# Patient Record
Sex: Female | Born: 1990 | Race: White | Hispanic: No | Marital: Married | Smoking: Never smoker
Health system: Southern US, Community
[De-identification: ages and names within clinical notes are randomized; demographics above are authoritative.]

## PROBLEM LIST (undated history)

## (undated) DIAGNOSIS — Z8489 Family history of other specified conditions: Secondary | ICD-10-CM

## (undated) DIAGNOSIS — C801 Malignant (primary) neoplasm, unspecified: Secondary | ICD-10-CM

## (undated) DIAGNOSIS — C73 Malignant neoplasm of thyroid gland: Secondary | ICD-10-CM

## (undated) HISTORY — PX: APPENDECTOMY: SHX54

## (undated) HISTORY — PX: OTHER SURGICAL HISTORY: SHX169

## (undated) HISTORY — PX: LYMPH NODE BIOPSY: SHX201

## (undated) HISTORY — DX: Malignant neoplasm of thyroid gland: C73

---
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
## 2009-04-28 ENCOUNTER — Emergency Department: Payer: Self-pay | Admitting: Emergency Medicine

## 2009-12-07 ENCOUNTER — Emergency Department: Payer: Self-pay | Admitting: Unknown Physician Specialty

## 2011-10-02 ENCOUNTER — Ambulatory Visit: Payer: Self-pay | Admitting: Orthopaedic Surgery

## 2011-10-17 ENCOUNTER — Emergency Department: Payer: Self-pay | Admitting: Emergency Medicine

## 2011-10-17 LAB — COMPREHENSIVE METABOLIC PANEL
Albumin: 4 g/dL (ref 3.4–5.0)
Alkaline Phosphatase: 66 U/L (ref 50–136)
BUN: 14 mg/dL (ref 7–18)
Bilirubin,Total: 0.5 mg/dL (ref 0.2–1.0)
Chloride: 107 mmol/L (ref 98–107)
Co2: 25 mmol/L (ref 21–32)
EGFR (African American): >60
EGFR (Non-African Amer.): >60
Osmolality: 287 (ref 275–301)
SGOT(AST): 20 U/L (ref 15–37)
SGPT (ALT): 30 U/L
Total Protein: 7.4 g/dL (ref 6.4–8.2)

## 2011-10-17 LAB — CBC
HCT: 48.2 % — ABNORMAL HIGH (ref 35.0–47.0)
HGB: 16.1 g/dL — ABNORMAL HIGH (ref 12.0–16.0)
MCH: 32.1 pg (ref 26.0–34.0)
MCHC: 33.5 g/dL (ref 32.0–36.0)
MCV: 96 fL (ref 80–100)
Platelet: 343 10*3/uL (ref 150–440)
RBC: 5.03 10*6/uL (ref 3.80–5.20)
RDW: 11.6 % (ref 11.5–14.5)

## 2011-10-17 LAB — URINALYSIS, COMPLETE
Leukocyte Esterase: NEGATIVE
Nitrite: NEGATIVE
Protein: NEGATIVE
Specific Gravity: 1.014 (ref 1.003–1.030)
WBC UR: <1 /HPF (ref 0–5)

## 2011-10-17 LAB — LIPASE, BLOOD: Lipase: 116 U/L (ref 73–393)

## 2011-10-17 LAB — PREGNANCY, URINE: Pregnancy Test, Urine: NEGATIVE m[IU]/mL

## 2012-08-01 DIAGNOSIS — R5383 Other fatigue: Secondary | ICD-10-CM | POA: Insufficient documentation

## 2013-03-06 ENCOUNTER — Observation Stay: Payer: Self-pay | Admitting: Obstetrics and Gynecology

## 2013-03-06 LAB — PIH PROFILE
Anion Gap: 9 (ref 7–16)
Calcium, Total: 8.6 mg/dL (ref 8.5–10.1)
Chloride: 111 mmol/L — ABNORMAL HIGH (ref 98–107)
Co2: 21 mmol/L (ref 21–32)
Creatinine: 0.81 mg/dL (ref 0.60–1.30)
EGFR (African American): >60
Glucose: 97 mg/dL (ref 65–99)
HCT: 37 % (ref 35.0–47.0)
MCHC: 35.2 g/dL (ref 32.0–36.0)
Platelet: 267 10*3/uL (ref 150–440)
Potassium: 3.7 mmol/L (ref 3.5–5.1)
RBC: 3.92 10*6/uL (ref 3.80–5.20)
SGOT(AST): 14 U/L — ABNORMAL LOW (ref 15–37)
Sodium: 141 mmol/L (ref 136–145)

## 2013-03-06 LAB — PROTEIN / CREATININE RATIO, URINE: Creatinine, Urine: 107.8 mg/dL (ref 30.0–125.0)

## 2013-03-07 ENCOUNTER — Ambulatory Visit: Payer: Self-pay | Admitting: Obstetrics and Gynecology

## 2013-03-08 ENCOUNTER — Inpatient Hospital Stay: Payer: Self-pay | Admitting: Obstetrics and Gynecology

## 2013-03-08 LAB — PROTEIN / CREATININE RATIO, URINE
Creatinine, Urine: 155.2 mg/dL — ABNORMAL HIGH (ref 30.0–125.0)
Protein, Random Urine: 754 mg/dL — ABNORMAL HIGH (ref 0–12)

## 2013-03-08 LAB — PIH PROFILE
Anion Gap: 7 (ref 7–16)
BUN: 12 mg/dL (ref 7–18)
Chloride: 111 mmol/L — ABNORMAL HIGH (ref 98–107)
Co2: 23 mmol/L (ref 21–32)
EGFR (African American): >60
EGFR (Non-African Amer.): >60
Glucose: 72 mg/dL (ref 65–99)
HGB: 12.6 g/dL (ref 12.0–16.0)
MCHC: 34.4 g/dL (ref 32.0–36.0)
MCV: 95 fL (ref 80–100)
Osmolality: 280 (ref 275–301)
Potassium: 3.9 mmol/L (ref 3.5–5.1)
RBC: 3.84 10*6/uL (ref 3.80–5.20)
RDW: 12.2 % (ref 11.5–14.5)
SGOT(AST): 10 U/L — ABNORMAL LOW (ref 15–37)
Uric Acid: 5.9 mg/dL (ref 2.6–6.0)

## 2013-03-09 LAB — PIH PROFILE
BUN: 14 mg/dL (ref 7–18)
Co2: 21 mmol/L (ref 21–32)
EGFR (African American): >60
EGFR (Non-African Amer.): >60
MCHC: 34.3 g/dL (ref 32.0–36.0)
MCV: 96 fL (ref 80–100)
Osmolality: 284 (ref 275–301)
Potassium: 3.8 mmol/L (ref 3.5–5.1)
RBC: 3.83 10*6/uL (ref 3.80–5.20)
SGOT(AST): 14 U/L — ABNORMAL LOW (ref 15–37)
Sodium: 143 mmol/L (ref 136–145)
Uric Acid: 6.2 mg/dL — ABNORMAL HIGH (ref 2.6–6.0)

## 2013-03-10 LAB — PIH PROFILE
Anion Gap: 5 — ABNORMAL LOW (ref 7–16)
Anion Gap: 6 — ABNORMAL LOW (ref 7–16)
BUN: 12 mg/dL (ref 7–18)
Calcium, Total: 8.7 mg/dL (ref 8.5–10.1)
Calcium, Total: 9.4 mg/dL (ref 8.5–10.1)
Chloride: 110 mmol/L — ABNORMAL HIGH (ref 98–107)
Co2: 25 mmol/L (ref 21–32)
Creatinine: 0.77 mg/dL (ref 0.60–1.30)
Creatinine: 0.66 mg/dL (ref 0.60–1.30)
EGFR (African American): >60
EGFR (Non-African Amer.): >60
Glucose: 71 mg/dL (ref 65–99)
HCT: 38 % (ref 35.0–47.0)
HCT: 41.9 % (ref 35.0–47.0)
HGB: 14.5 g/dL (ref 12.0–16.0)
MCH: 32.6 pg (ref 26.0–34.0)
MCHC: 34.3 g/dL (ref 32.0–36.0)
MCHC: 34.6 g/dL (ref 32.0–36.0)
MCV: 95 fL (ref 80–100)
MCV: 94 fL (ref 80–100)
Potassium: 3.9 mmol/L (ref 3.5–5.1)
Potassium: 4.3 mmol/L (ref 3.5–5.1)
RBC: 4 10*6/uL (ref 3.80–5.20)
RDW: 12.2 % (ref 11.5–14.5)
RDW: 12.3 % (ref 11.5–14.5)
SGOT(AST): 26 U/L (ref 15–37)
SGOT(AST): 55 U/L — ABNORMAL HIGH (ref 15–37)
Sodium: 141 mmol/L (ref 136–145)
Sodium: 140 mmol/L (ref 136–145)
Uric Acid: 5.7 mg/dL (ref 2.6–6.0)
WBC: 20.4 10*3/uL — ABNORMAL HIGH (ref 3.6–11.0)

## 2013-03-10 LAB — PROTEIN, URINE, 24 HOUR
Collection Hours: 24 hours
Protein, 24 Hour Urine: 16646 mg/24HR — ABNORMAL HIGH (ref 30–149)

## 2013-03-10 LAB — PROTEIN / CREATININE RATIO, URINE: Protein, Random Urine: 1628 mg/dL — ABNORMAL HIGH (ref 0–12)

## 2013-03-11 LAB — PIH PROFILE
Anion Gap: 8 (ref 7–16)
Calcium, Total: 8.4 mg/dL — ABNORMAL LOW (ref 8.5–10.1)
Co2: 24 mmol/L (ref 21–32)
EGFR (Non-African Amer.): >60
HCT: 37.9 % (ref 35.0–47.0)
MCH: 32.2 pg (ref 26.0–34.0)
MCHC: 34 g/dL (ref 32.0–36.0)
Osmolality: 277 (ref 275–301)
Platelet: 231 10*3/uL (ref 150–440)
Potassium: 4.1 mmol/L (ref 3.5–5.1)
SGOT(AST): 54 U/L — ABNORMAL HIGH (ref 15–37)
Sodium: 139 mmol/L (ref 136–145)
WBC: 18 10*3/uL — ABNORMAL HIGH (ref 3.6–11.0)

## 2013-03-14 LAB — PATHOLOGY REPORT

## 2014-09-06 IMAGING — US ULTRAOUND OB LIMITED - NRPT [facility]
1 series · 14 of 28 positions shown · non-contrast
Comparison: none

[Series 1: ultraound ob limited - nrpt [facility] · 0.28mm/px · 14 of 42 slices shown]
[im 2/42]
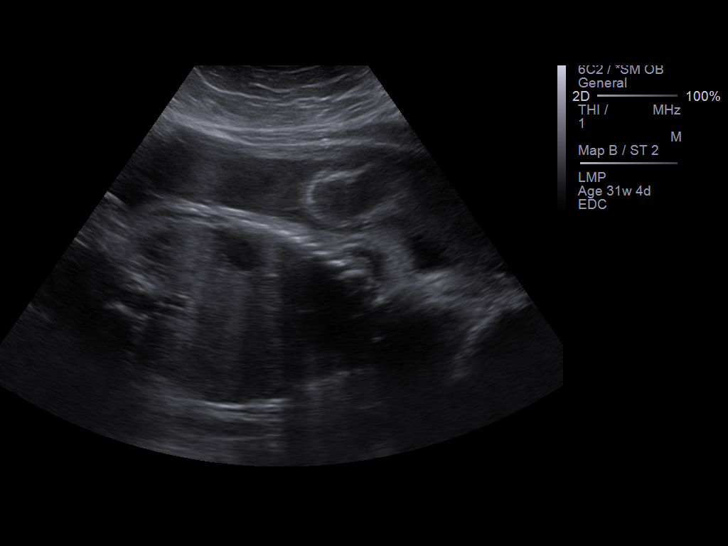
[im 5/42]
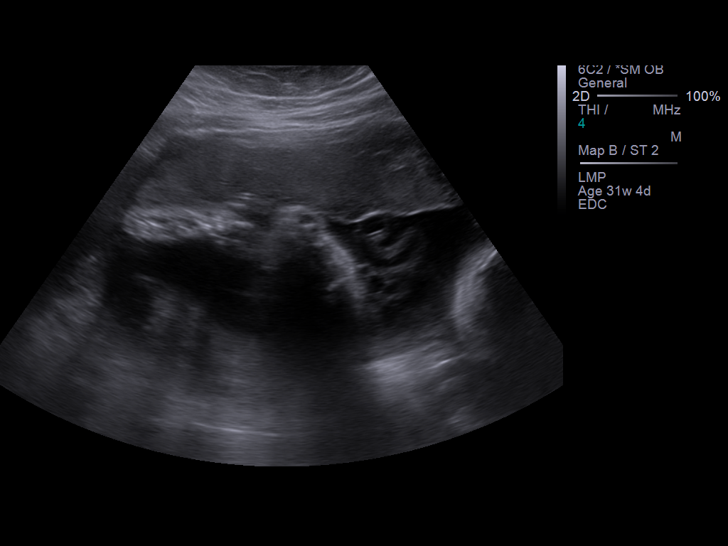
[im 8/42]
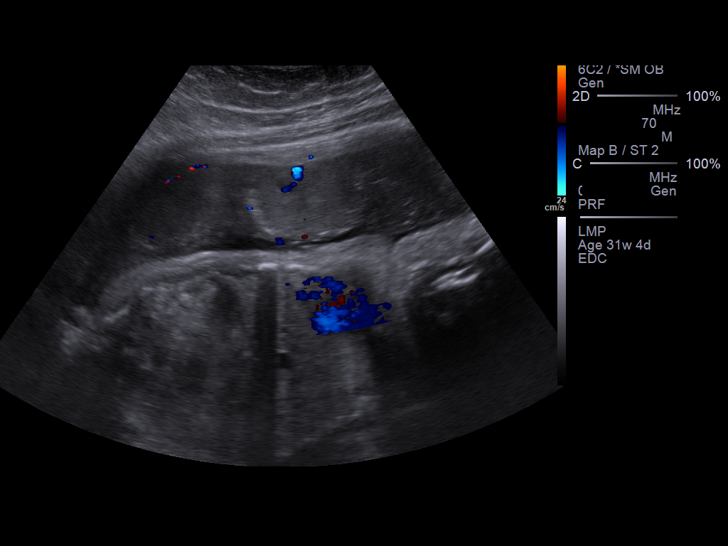
[im 11/42]
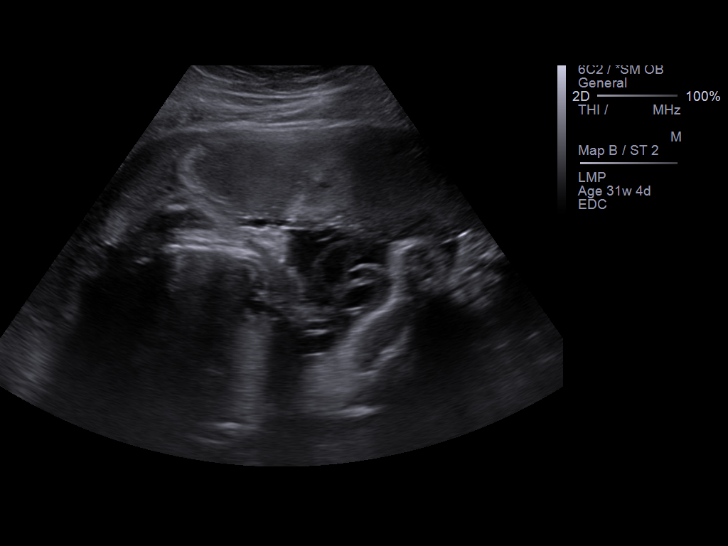
[im 14/42]
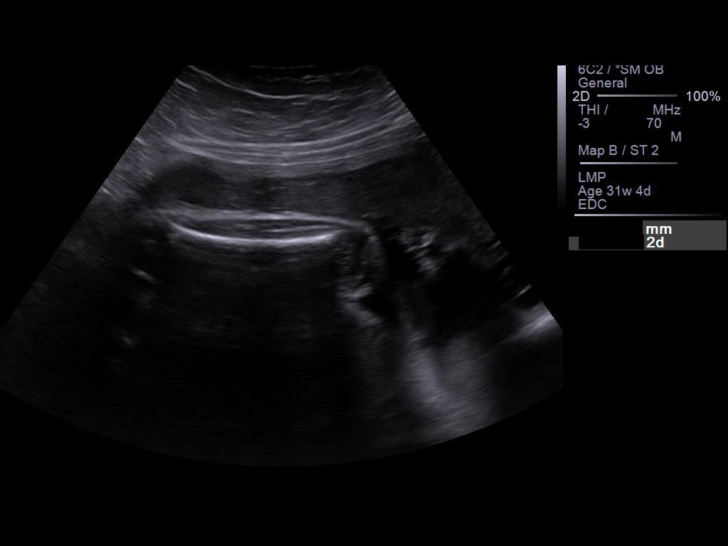
[im 17/42]
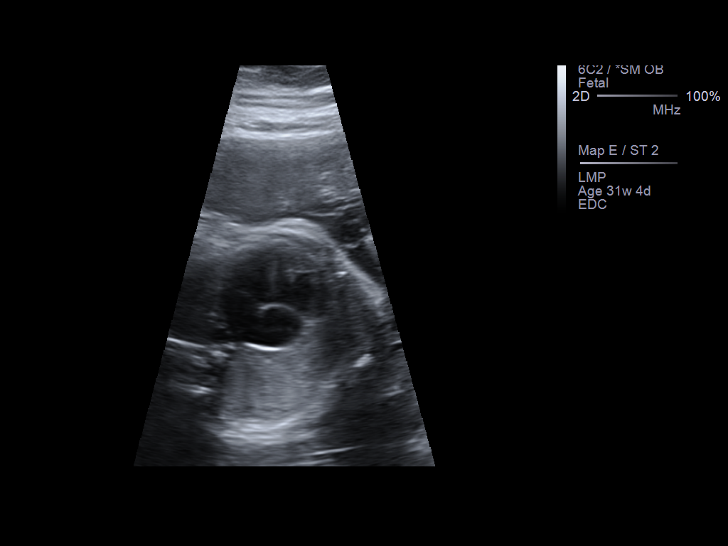
[im 20/42]
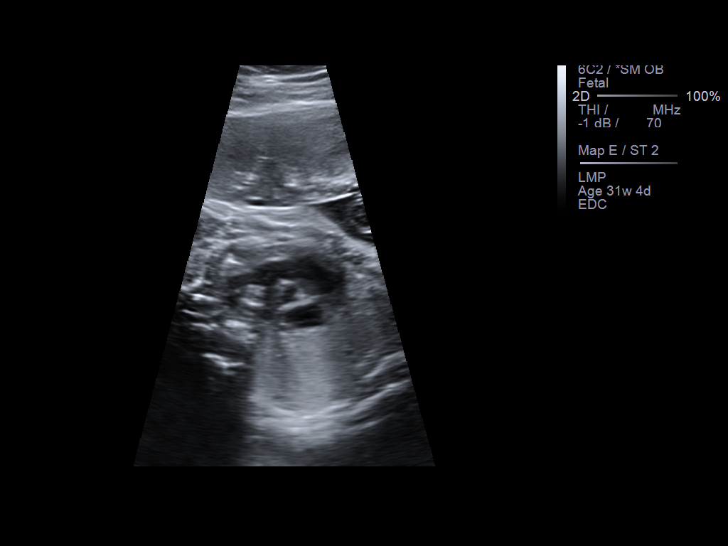
[im 23/42]
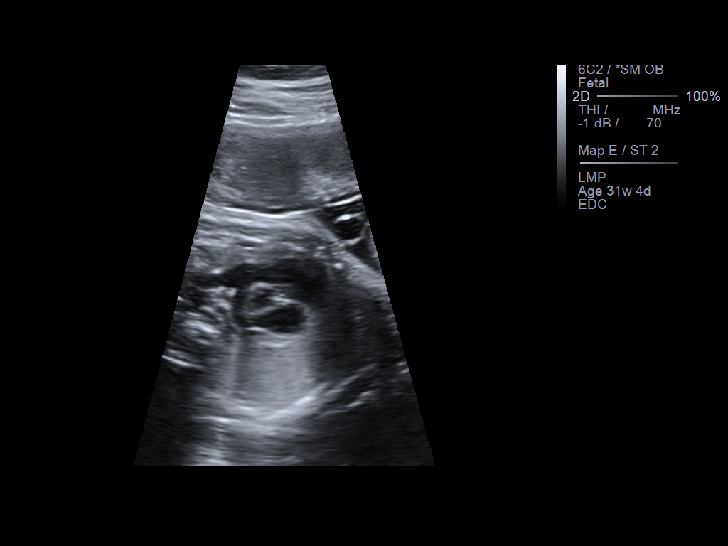
[im 26/42]
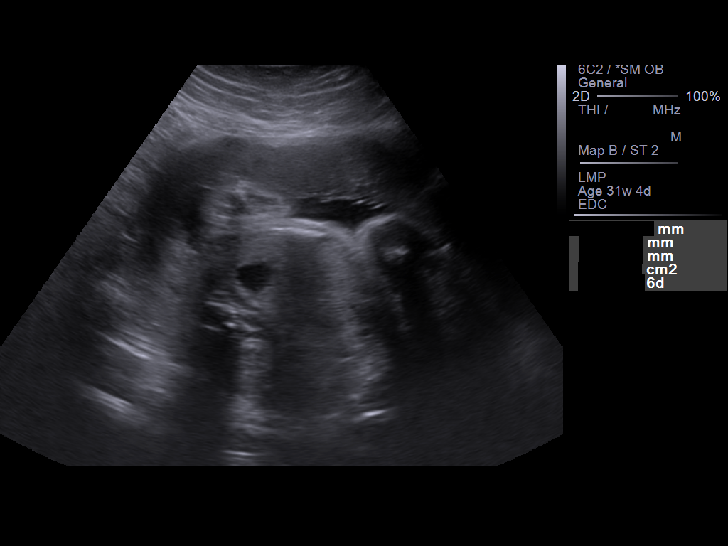
[im 29/42]
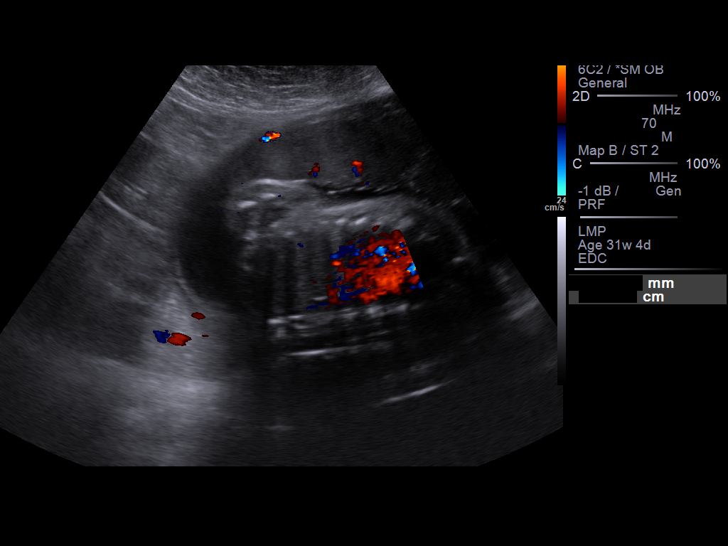
[im 32/42]
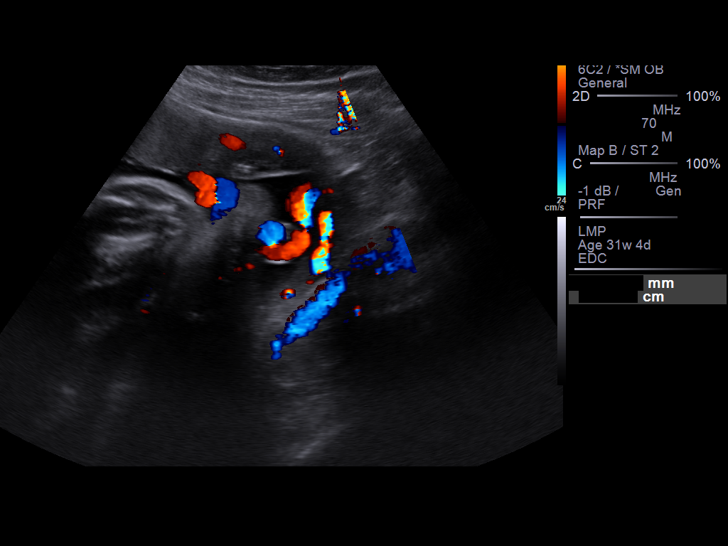
[im 35/42]
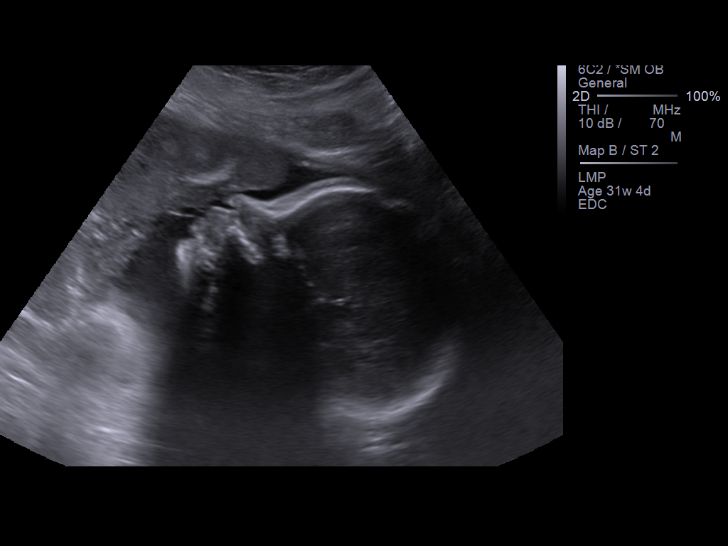
[im 38/42]
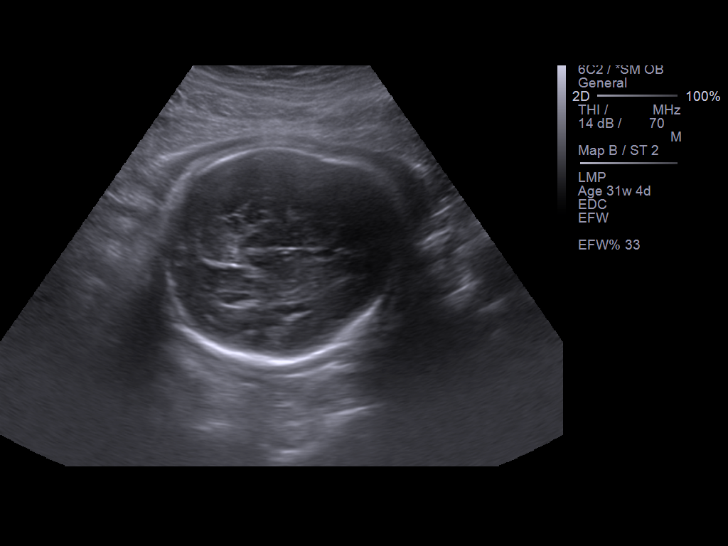
[im 42/42]
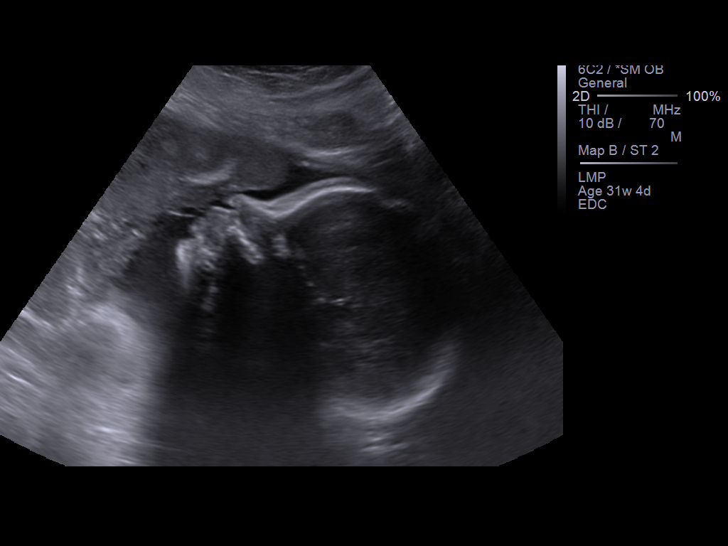

[14 of 28 positions shown; findings below may reference images not displayed]

IMAGES IMPORTED FROM THE SYN[employer] WORKFLOW SYSTEM
NO DICTATION FOR STUDY

## 2014-12-11 ENCOUNTER — Ambulatory Visit
Admit: 2014-12-11 | Disposition: A | Discharge status: Home / Self Care | Payer: Self-pay | Attending: Family Medicine | Admitting: Family Medicine

## 2014-12-14 NOTE — Op Note (Signed)
PATIENT NAME:  Lindsay Ramos, Lindsay Ramos MR#:  [id number] DATE OF BIRTH:  Jun 02, 1991  DATE OF PROCEDURE:  03/10/2013  PREOPERATIVE DIAGNOSES: 1.  A 31.6 week intrauterine pregnancy, delivered.  2.  Severe preeclampsia.   POSTOPERATIVE DIAGNOSES: 1.  A 31.6 week intrauterine pregnancy, delivered.  2.  Severe preeclampsia.  3.  Viable female, 1620 grams.   OPERATIVE PROCEDURE: Primary low cervical transverse cesarean section.   SURGEON: Brayton Mars, M.D.   FIRST ASSISTANT: None.   ANESTHESIA: Spinal.   INDICATIONS: The patient is a 24 year old married white female gravida 1, para 0 at 31.[redacted] weeks gestation who was admitted two days prior with severe preeclampsia findings. The patient had worsening trend of labs as well as symptoms and decision was made to proceed with cesarean section delivery due to the unfavorable cervix.   FINDINGS AT SURGERY: Revealed a viable female infant, 1620 grams weight with Apgars of 9 and 9 at 1 and 5 minutes, respectively. The uterus, tubes, and ovaries were grossly normal.   DESCRIPTION OF PROCEDURE: The patient was brought to the operating room where she was placed in the sitting position. Spinal anesthetic was introduced without difficulty. She was placed in the supine position with a right lateral hip roll in place. A ChloraPrep abdominal prep and drape was performed in the standard fashion. A Foley catheter had previously been placed and was draining clear yellow urine. After checking for adequate level of anesthesia, Pfannenstiel incision was made to the abdomen. The fascia was incised transversely and extended bilaterally with Mayo scissors. Midline raphe was incised, separated and the peritoneum was entered. The bladder flap was created over lower uterine segment through sharp dissection. A low transverse incision was made in the uterus and this was extended bluntly bilaterally. The infant was delivered through a vertex presentation and was vigorous at  birth. The delayed clamping of the cord was done. The infant was vigorous at birth. The infant was handed off to the awaiting resuscitation team. Cord blood gas was obtained. Cord blood sampling was obtained. The placenta was expressed from the uterus and sent to pathology. Uterus was externalized and the wet laps were used to clear out all residual debris from the intrauterine cavity. The uterus was closed in two layers using #1 chromic suture. First layer was a running locking stitch. The second layer was an imbricating layer. The uterus was placed back into the abdominopelvic cavity. Gutters were cleared of all debris with laps. The incision was closed in layers using 0 Maxon on the fascia in a simple running manner. The skin was closed with staples. A pressure dressing was applied. The patient was then mobilized and taken to the recovery room in satisfactory condition.   ESTIMATED BLOOD LOSS: 500 mL.   IV FLUIDS:  800 mL of crystalloid.   URINE OUTPUT:   100 mL of urine.   The patient received Ancef antibiotic prophylaxis.    ____________________________ Alanda Slim Peri Kreft, MD mad:dp D: 03/10/2013 23:10:42 ET T: 03/11/2013 07:06:27 ET JOB#: [id number]  cc: Hassell Done A. Adore Kithcart, MD, <Dictator> [facility] Alanda Slim Saisha Hogue MD ELECTRONICALLY SIGNED 03/28/2013 7:03

## 2014-12-20 ENCOUNTER — Ambulatory Visit
Admit: 2014-12-20 | Disposition: A | Discharge status: Home / Self Care | Payer: Self-pay | Attending: Oncology | Admitting: Oncology

## 2014-12-20 LAB — COMPREHENSIVE METABOLIC PANEL
ANION GAP: 5 — AB (ref 7–16)
Albumin: 4.1 g/dL
Alkaline Phosphatase: 65 U/L
BILIRUBIN TOTAL: 0.5 mg/dL
BUN: 13 mg/dL
CREATININE: 0.85 mg/dL
Calcium, Total: 9.5 mg/dL
Chloride: 106 mmol/L
Co2: 26 mmol/L
EGFR (African American): >60
GLUCOSE: 90 mg/dL
Potassium: 3.8 mmol/L
SGOT(AST): 22 U/L
SGPT (ALT): 27 U/L
SODIUM: 137 mmol/L
TOTAL PROTEIN: 7.5 g/dL

## 2014-12-26 DIAGNOSIS — C73 Malignant neoplasm of thyroid gland: Secondary | ICD-10-CM | POA: Insufficient documentation

## 2014-12-27 ENCOUNTER — Inpatient Hospital Stay: Admission: RE | Admit: 2014-12-27 | Payer: Self-pay | Source: Ambulatory Visit

## 2014-12-27 ENCOUNTER — Encounter: Payer: Self-pay | Admitting: *Deleted

## 2014-12-27 DIAGNOSIS — Z79899 Other long term (current) drug therapy: Secondary | ICD-10-CM | POA: Diagnosis not present

## 2014-12-27 DIAGNOSIS — Z885 Allergy status to narcotic agent status: Secondary | ICD-10-CM | POA: Diagnosis not present

## 2014-12-27 DIAGNOSIS — E041 Nontoxic single thyroid nodule: Secondary | ICD-10-CM | POA: Diagnosis not present

## 2014-12-27 DIAGNOSIS — D497 Neoplasm of unspecified behavior of endocrine glands and other parts of nervous system: Secondary | ICD-10-CM | POA: Diagnosis present

## 2014-12-27 DIAGNOSIS — C770 Secondary and unspecified malignant neoplasm of lymph nodes of head, face and neck: Secondary | ICD-10-CM | POA: Diagnosis not present

## 2014-12-27 DIAGNOSIS — Z809 Family history of malignant neoplasm, unspecified: Secondary | ICD-10-CM | POA: Diagnosis not present

## 2014-12-27 DIAGNOSIS — C73 Malignant neoplasm of thyroid gland: Secondary | ICD-10-CM | POA: Diagnosis not present

## 2015-01-01 ENCOUNTER — Encounter: Payer: Self-pay | Admitting: *Deleted

## 2015-01-01 ENCOUNTER — Ambulatory Visit: Payer: BLUE CROSS/BLUE SHIELD | Admitting: Anesthesiology

## 2015-01-01 ENCOUNTER — Encounter: Payer: Self-pay | Admitting: Unknown Physician Specialty

## 2015-01-01 ENCOUNTER — Observation Stay
Admission: RE | Admit: 2015-01-01 | Discharge: 2015-01-02 | Disposition: A | Discharge status: Home / Self Care | Payer: BLUE CROSS/BLUE SHIELD | Source: Ambulatory Visit | Attending: Unknown Physician Specialty | Admitting: Unknown Physician Specialty

## 2015-01-01 ENCOUNTER — Encounter
Admission: RE | Disposition: A | Discharge status: Home / Self Care | Payer: Self-pay | Source: Ambulatory Visit | Attending: Unknown Physician Specialty

## 2015-01-01 DIAGNOSIS — Z885 Allergy status to narcotic agent status: Secondary | ICD-10-CM | POA: Insufficient documentation

## 2015-01-01 DIAGNOSIS — E041 Nontoxic single thyroid nodule: Secondary | ICD-10-CM | POA: Insufficient documentation

## 2015-01-01 DIAGNOSIS — Z809 Family history of malignant neoplasm, unspecified: Secondary | ICD-10-CM | POA: Insufficient documentation

## 2015-01-01 DIAGNOSIS — Z79899 Other long term (current) drug therapy: Secondary | ICD-10-CM | POA: Insufficient documentation

## 2015-01-01 DIAGNOSIS — C770 Secondary and unspecified malignant neoplasm of lymph nodes of head, face and neck: Secondary | ICD-10-CM | POA: Insufficient documentation

## 2015-01-01 DIAGNOSIS — C73 Malignant neoplasm of thyroid gland: Principal | ICD-10-CM | POA: Insufficient documentation

## 2015-01-01 HISTORY — DX: Malignant (primary) neoplasm, unspecified: C80.1

## 2015-01-01 HISTORY — PX: THYROIDECTOMY: SHX17

## 2015-01-01 HISTORY — DX: Family history of other specified conditions: Z84.89

## 2015-01-01 LAB — POCT PREGNANCY, URINE: Preg Test, Ur: NEGATIVE

## 2015-01-01 LAB — CALCIUM
Calcium: 8.9 mg/dL (ref 8.9–10.3)
Calcium: 8.9 mg/dL (ref 8.9–10.3)

## 2015-01-01 SURGERY — THYROIDECTOMY
Anesthesia: General | Wound class: Clean

## 2015-01-01 MED ORDER — FAMOTIDINE 20 MG PO TABS
20.0000 mg | ORAL_TABLET | Freq: Once | ORAL | Status: AC
  Administered 2015-01-01: 20 mg via ORAL

## 2015-01-01 MED ORDER — LIDOCAINE-EPINEPHRINE 1 %-1:100000 IJ SOLN
INTRAMUSCULAR | Status: DC | PRN
  Administered 2015-01-01: 4 mL

## 2015-01-01 MED ORDER — ACETAMINOPHEN 650 MG RE SUPP: 650.0000 mg | RECTAL | Status: DC | PRN

## 2015-01-01 MED ORDER — ONDANSETRON HCL 4 MG/2ML IJ SOLN
INTRAMUSCULAR | Status: DC | PRN
  Administered 2015-01-01: 4 mg via INTRAVENOUS

## 2015-01-01 MED ORDER — ONDANSETRON HCL 4 MG/2ML IJ SOLN
4.0000 mg | INTRAMUSCULAR | Status: DC | PRN
  Administered 2015-01-01: 4 mg via INTRAVENOUS
  Filled 2015-01-01: qty 2

## 2015-01-01 MED ORDER — PROPOFOL 10 MG/ML IV BOLUS
INTRAVENOUS | Status: DC | PRN
  Administered 2015-01-01: 150 mg via INTRAVENOUS

## 2015-01-01 MED ORDER — ACETAMINOPHEN 160 MG/5ML PO SOLN
650.0000 mg | ORAL | Status: DC | PRN
  Administered 2015-01-01 – 2015-01-02 (×2): 650 mg via ORAL
  Administered 2015-01-02: 640 mg via ORAL
  Filled 2015-01-01 (×2): qty 20.3

## 2015-01-01 MED ORDER — FENTANYL CITRATE (PF) 100 MCG/2ML IJ SOLN
25.0000 ug | INTRAMUSCULAR | Status: AC | PRN
  Administered 2015-01-01 (×4): 25 ug via INTRAVENOUS

## 2015-01-01 MED ORDER — HYDROMORPHONE HCL 1 MG/ML IJ SOLN
INTRAMUSCULAR | Status: AC
  Administered 2015-01-01: 11:00:00
  Filled 2015-01-01: qty 1

## 2015-01-01 MED ORDER — HYDRALAZINE HCL 20 MG/ML IJ SOLN
10.0000 mg | Freq: Once | INTRAMUSCULAR | Status: AC
  Administered 2015-01-01: 10 mg via INTRAVENOUS

## 2015-01-01 MED ORDER — HYDRALAZINE HCL 20 MG/ML IJ SOLN
INTRAMUSCULAR | Status: AC
  Administered 2015-01-01: 12:00:00
  Filled 2015-01-01: qty 1

## 2015-01-01 MED ORDER — ONDANSETRON HCL 4 MG PO TABS: 4.0000 mg | ORAL_TABLET | ORAL | Status: DC | PRN

## 2015-01-01 MED ORDER — MIDAZOLAM HCL 2 MG/2ML IJ SOLN
INTRAMUSCULAR | Status: DC | PRN
  Administered 2015-01-01: 2 mg via INTRAVENOUS

## 2015-01-01 MED ORDER — FAMOTIDINE 20 MG PO TABS
ORAL_TABLET | ORAL | Status: AC
  Administered 2015-01-01: 20 mg via ORAL
  Filled 2015-01-01: qty 1

## 2015-01-01 MED ORDER — LABETALOL HCL 5 MG/ML IV SOLN
20.0000 mg | Freq: Once | INTRAVENOUS | Status: DC
Start: 2015-01-01 — End: 2015-01-01

## 2015-01-01 MED ORDER — FENTANYL CITRATE (PF) 100 MCG/2ML IJ SOLN
INTRAMUSCULAR | Status: AC
  Administered 2015-01-01: 14:00:00
  Filled 2015-01-01: qty 2

## 2015-01-01 MED ORDER — HYDROCODONE-ACETAMINOPHEN 5-325 MG PO TABS
1.0000 | ORAL_TABLET | ORAL | Status: DC | PRN
  Administered 2015-01-01: 2 via ORAL
  Filled 2015-01-01: qty 2

## 2015-01-01 MED ORDER — LIDOCAINE-EPINEPHRINE 1 %-1:100000 IJ SOLN
INTRAMUSCULAR | Status: AC
  Administered 2015-01-01: 07:00:00
  Filled 2015-01-01: qty 1

## 2015-01-01 MED ORDER — HYDROMORPHONE HCL 1 MG/ML IJ SOLN
INTRAMUSCULAR | Status: AC
  Administered 2015-01-01: 12:00:00
  Filled 2015-01-01: qty 1

## 2015-01-01 MED ORDER — DIPHENHYDRAMINE HCL 25 MG PO CAPS
25.0000 mg | ORAL_CAPSULE | Freq: Four times a day (QID) | ORAL | Status: DC | PRN
  Administered 2015-01-01: 25 mg via ORAL

## 2015-01-01 MED ORDER — HYDROMORPHONE HCL 1 MG/ML IJ SOLN
0.2500 mg | INTRAMUSCULAR | Status: DC | PRN
  Administered 2015-01-01 (×4): 0.5 mg via INTRAVENOUS

## 2015-01-01 MED ORDER — FENTANYL CITRATE (PF) 100 MCG/2ML IJ SOLN
INTRAMUSCULAR | Status: DC | PRN
  Administered 2015-01-01: 50 ug via INTRAVENOUS
  Administered 2015-01-01: 100 ug via INTRAVENOUS
  Administered 2015-01-01 (×2): 50 ug via INTRAVENOUS

## 2015-01-01 MED ORDER — DIPHENHYDRAMINE HCL 25 MG PO CAPS
ORAL_CAPSULE | ORAL | Status: AC
  Filled 2015-01-01: qty 1

## 2015-01-01 MED ORDER — DEXAMETHASONE SODIUM PHOSPHATE 4 MG/ML IJ SOLN
INTRAMUSCULAR | Status: DC | PRN
  Administered 2015-01-01: 10 mg via INTRAVENOUS

## 2015-01-01 MED ORDER — LABETALOL HCL 5 MG/ML IV SOLN
INTRAVENOUS | Status: AC
  Administered 2015-01-01: 13:00:00
  Filled 2015-01-01: qty 4

## 2015-01-01 MED ORDER — ONDANSETRON HCL 4 MG/2ML IJ SOLN: 4.0000 mg | Freq: Once | INTRAMUSCULAR | Status: DC | PRN

## 2015-01-01 MED ORDER — KCL IN DEXTROSE-NACL 10-5-0.45 MEQ/L-%-% IV SOLN
INTRAVENOUS | Status: DC
  Administered 2015-01-01 – 2015-01-02 (×2): via INTRAVENOUS
  Filled 2015-01-01 (×4): qty 1000

## 2015-01-01 MED ORDER — 0.9 % SODIUM CHLORIDE (POUR BTL) OPTIME
TOPICAL | Status: DC | PRN
  Administered 2015-01-01: 230 mL

## 2015-01-01 MED ORDER — LACTATED RINGERS IV SOLN
INTRAVENOUS | Status: DC
  Administered 2015-01-01: 08:00:00 via INTRAVENOUS

## 2015-01-01 MED ORDER — CALCIUM CARBONATE-VITAMIN D 500-200 MG-UNIT PO TABS
2.0000 | ORAL_TABLET | Freq: Two times a day (BID) | ORAL | Status: DC
Start: 2015-01-01 — End: 2015-01-02
  Administered 2015-01-01 – 2015-01-02 (×2): 2 via ORAL
  Filled 2015-01-01 (×2): qty 2

## 2015-01-01 MED ORDER — SUCCINYLCHOLINE CHLORIDE 20 MG/ML IJ SOLN
INTRAMUSCULAR | Status: DC | PRN
  Administered 2015-01-01: 100 mg via INTRAVENOUS

## 2015-01-01 SURGICAL SUPPLY — 34 items
BLADE SURG 15 STRL LF DISP TIS (BLADE) ×1 IMPLANT
BLADE SURG 15 STRL SS (BLADE) ×1
CANISTER SUCT 1200ML W/VALVE (MISCELLANEOUS) ×2 IMPLANT
CORD BIP STRL DISP 12FT (MISCELLANEOUS) ×2 IMPLANT
DRAIN TLS ROUND 10FR (DRAIN) ×4 IMPLANT
DRAPE MAG INST 16X20 L/F (DRAPES) ×2 IMPLANT
DRSG TEGADERM 2-3/8X2-3/4 SM (GAUZE/BANDAGES/DRESSINGS) ×2 IMPLANT
ELECT LARYNGEAL 6/7 (MISCELLANEOUS) ×2
ELECT LARYNGEAL 8/9 (MISCELLANEOUS)
ELECTRODE LARYNGEAL 6/7 (MISCELLANEOUS) ×1 IMPLANT
ELECTRODE LARYNGEAL 8/9 (MISCELLANEOUS) IMPLANT
FORCEPS JEWEL BIP 4-3/4 STR (INSTRUMENTS) ×2 IMPLANT
GLOVE BIO SURGEON STRL SZ7.5 (GLOVE) ×12 IMPLANT
GOWN STRL REUS W/ TWL LRG LVL3 (GOWN DISPOSABLE) ×4 IMPLANT
GOWN STRL REUS W/TWL LRG LVL3 (GOWN DISPOSABLE) ×4
HARMONIC SCALPEL FOCUS (MISCELLANEOUS) ×2 IMPLANT
HEMOSTAT SURGICEL 2X3 (HEMOSTASIS) ×2 IMPLANT
HOOK STAY 5M SHARP BLUNT 3316- (MISCELLANEOUS) IMPLANT
KIT RM TURNOVER STRD PROC AR (KITS) ×2 IMPLANT
LABEL OR SOLS (LABEL) IMPLANT
LIQUID BAND (GAUZE/BANDAGES/DRESSINGS) ×2 IMPLANT
NS IRRIG 500ML POUR BTL (IV SOLUTION) ×2 IMPLANT
PACK HEAD/NECK (MISCELLANEOUS) ×2 IMPLANT
PAD GROUND ADULT SPLIT (MISCELLANEOUS) ×2 IMPLANT
PROBE NEUROSIGN BIPOL (MISCELLANEOUS) ×1 IMPLANT
PROBE NEUROSIGN BIPOLAR (MISCELLANEOUS) ×1
SPONGE KITTNER 5P (MISCELLANEOUS) ×2 IMPLANT
SPONGE XRAY 4X4 16PLY STRL (MISCELLANEOUS) ×2 IMPLANT
STAPLER SKIN PROX 35W (STAPLE) ×2 IMPLANT
SUT SILK 2 0 (SUTURE) ×1
SUT SILK 2 0 SH (SUTURE) ×2 IMPLANT
SUT SILK 2-0 18XBRD TIE 12 (SUTURE) ×1 IMPLANT
SUT VIC AB 4-0 RB1 18 (SUTURE) ×4 IMPLANT
SYSTEM CHEST DRAIN TLS 7FR (DRAIN) IMPLANT

## 2015-01-01 NOTE — H&P (Signed)
  H+P  Reviewed and will be scanned in later. No changes noted. 

## 2015-01-01 NOTE — H&P (Signed)
L&D Evaluation:  History:  HPI 66 yomwf G1P0, EGA 31.[redacted] weeks gestation, estimated date of confinement 05/07/2013 , admitted with pre eclampsia. Pt has been having intermittent HA, RUQ discomfort, increased peripheral edema and 9 pound weight gain in 1 week. Pt has received 2 doses of betamethasone this past week.   Patient's Medical History No Chronic Illness  Anxiety; Increased BMI; PIH; GERD; Oral Ulcers   Patient's Surgical History Appendectomy; Lumpectomy   Medications Pre Natal Vitamins  Zantac; Unisom;B6   Allergies Codeine, Shellfish   Social History none   Family History Mom - Preeclampsia   ROS:  ROS All normal except for HPI; Good FM; No Contractions   Exam:  Vital Signs BP >140/90   Urine Protein 3+, Urine P:C ratio 4800   General no apparent distress, Facial Edema   Mental Status clear   Chest clear   Heart normal sinus rhythm   Abdomen gravid, non-tender   Estimated Fetal Weight Average for gestational age   Back no CVAT   Edema 2+   Reflexes 2+   Clonus negative   Pelvic no external lesions   Mebranes Intact   FHT normal rate with no decels   FHT Description Reactive NST   Ucx absent   Skin dry   Lymph no lymphadenopathy   Impression:  Impression 31.4 qweek Intrauterine pregnancy; Preeclampsia; Oligohydramnios on U/S 03/08/2013 (AFI 5.5 cm); Normal PIH labs;   Plan:  Plan Admit for Bed Rest, BP monitoring; [facility] BID; [facility] Consult; BiWeekly NST/AFI; Repeat PIH Panel in AM   Electronic Signatures: Ardyn Forge, Alanda Slim (MD)  (Signed 16-Jul-14 17:50)  Authored: L&D Evaluation   Last Updated: 16-Jul-14 17:50 by Brayton Mars (MD)

## 2015-01-01 NOTE — Anesthesia Preprocedure Evaluation (Signed)
Anesthesia Evaluation  Patient identified by MRN, date of birth, ID band Patient awake    History of Anesthesia Complications (+) Family history of anesthesia reaction  Airway Mallampati: II  TM Distance: >3 FB     Dental no notable dental hx.    Pulmonary neg pulmonary ROS,  breath sounds clear to auscultation  Pulmonary exam normal       Cardiovascular negative cardio ROS  Rhythm:Regular Rate:Normal     Neuro/Psych negative neurological ROS  negative psych ROS   GI/Hepatic negative GI ROS, Neg liver ROS,   Endo/Other  negative endocrine ROS  Renal/GU negative Renal ROS  negative genitourinary   Musculoskeletal negative musculoskeletal ROS (+)   Abdominal Normal abdominal exam  (+)   Peds negative pediatric ROS (+)  Hematology negative hematology ROS (+)   Anesthesia Other Findings   Reproductive/Obstetrics negative OB ROS                             Anesthesia Physical Anesthesia Plan  ASA: II  Anesthesia Plan: General   Post-op Pain Management:    Induction: Intravenous  Airway Management Planned: Oral ETT  Additional Equipment:   Intra-op Plan:   Post-operative Plan: Extubation in OR  Informed Consent: I have reviewed the patients History and Physical, chart, labs and discussed the procedure including the risks, benefits and alternatives for the proposed anesthesia with the patient or authorized representative who has indicated his/her understanding and acceptance.     Plan Discussed with: CRNA and Surgeon  Anesthesia Plan Comments:         Anesthesia Quick Evaluation

## 2015-01-01 NOTE — Brief Op Note (Signed)
01/01/2015  10:29 AM  PATIENT:  Denton Brick  24 y.o. female  PRE-OPERATIVE DIAGNOSIS:  popillary carcinoma  POST-OPERATIVE DIAGNOSIS:  Papillary carcinoma   PROCEDURE:  Procedure(s): THYROIDECTOMY, total with laryngeal nerve monitoring  (N/A)  SURGEON:  Surgeon(s) and Role:    * Beverly Gust, MD - Primary    * Clyde Canterbury, MD - Assisting  PHYSICIAN ASSISTANT:   ASSISTANTSRichardson Landry   ANESTHESIA:   general  EBL:  Total I/O In: 100 [I.V.:100] Out: -   BLOOD ADMINISTERED:none  DRAINS: (#10) TLS Drain(s) to suction in the OR   LOCAL MEDICATIONS USED:  LIDOCAINE   SPECIMEN:  Source of Specimen:  total thryoid  DISPOSITION OF SPECIMEN:  PATHOLOGY  COUNTS:  YES  TOURNIQUET:  * No tourniquets in log *  DICTATION: .Other Dictation: Dictation Number [id number]  PLAN OF CARE: Admit for overnight observation  PATIENT DISPOSITION:  PACU - hemodynamically stable.   Delay start of Pharmacological VTE agent (>24hrs) due to surgical blood loss or risk of bleeding: yes

## 2015-01-01 NOTE — Transfer of Care (Signed)
Immediate Anesthesia Transfer of Care Note  Patient: Lindsay Ramos  Procedure(s) Performed: Procedure(s): THYROIDECTOMY, total with laryngeal nerve monitoring  (N/A)  Patient Location: PACU  Anesthesia Type:General  Level of Consciousness: patient cooperative and responds to stimulation  Airway & Oxygen Therapy: Patient Spontanous Breathing and Patient connected to face mask oxygen  Post-op Assessment: Report given to RN and Post -op Vital signs reviewed and stable  Post vital signs: Reviewed and stable  Last Vitals:  Filed Vitals:   01/01/15 0727  BP: 128/79  Pulse: 95  Temp: 37 C  Resp: 16    Complications: No apparent anesthesia complications

## 2015-01-01 NOTE — Op Note (Signed)
NAME:  Lindsay Ramos, Lindsay Ramos            ACCOUNT NO.:  [id number]  MEDICAL RECORD NO.:  [id number]  LOCATION:  [facility]                         FACILITY:  [facility]  PHYSICIAN:  Roena Malady, MD  DATE OF BIRTH:  03/07/1991  DATE OF PROCEDURE:  01/01/2015 DATE OF DISCHARGE:                              OPERATIVE REPORT   ASSISTANT:  Richardson Landry  PROCEDURE PERFORMED:  Total thyroidectomy and laryngeal nerve monitoring for an hour and a half.  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  OPERATIVE FINDINGS:  A large nodular lesion of the right lobe of the thyroid.  A small nodule was palpated within the left.  No adenopathy noted.  DESCRIPTION OF PROCEDURE:  Lindsay Ramos was identified in the holding area, taken to the operating room and placed in supine position where, after general endotracheal anesthesia with a laryngeal monitoring endotracheal tube, the patient's neck was gently extended and an incision line was marked along a natural skin crease line.  Local anesthetic with 1% lidocaine with 1/100000 epinephrine was used to inject along this line.  A total of 5 cc was used.  With the neck prepped and draped sterilely, a 15 blade was used to incise down to and through the platysma muscle.  Hemostasis was achieved using the Bovie cautery. Strap muscles were identified in the midline and divided.  Beginning on the right-hand side, the strap muscles were retracted laterally.  There was a large, firm mass in the mid and upper pole portions of the thyroid.  There were multiple small feeding vessels which entered the gland.  These were divided using the harmonic scalpel.  The superior pole was isolated.  The vessels were isolated and divided using the Harmonic scalpel.  Superior and inferior parathyroid glands were identified and left on their vascular pedicles intact.  The gland was then medialized.  The recurrent laryngeal  nerve was identified.  The tracheoesophageal groove was stimulated and remained intact throughout the procedure.  The gland was then peeled medially, releasing any small feeding vessels.  Berry ligament was released and brought over the anterior trachea.  With the right side dissected, the operation then turned to the left lobe.  In similar fashion, the strap muscles were retracted laterally.  There were multiple small feeding vessels which were divided using the Harmonic scalpel.  Superior pole vasculature was isolated and divided using the Harmonic scalpel.  The gland was then medialized.  Again, the superior and inferior parathyroid glands were identified and left on their vascular pedicles.  The recurrent laryngeal nerve was identified, and the tracheoesophageal groove was identified, stimulated and left intact.  The gland was then peeled medially, Berry ligament was released and allowed the gland to be removed in its entirety.  A stitch was placed in the right upper lobe for marking.  The wound was then copiously irrigated with saline.  Any small bleeding points were cauterized using the micro bipolar.  Both recurrent laryngeal nerves were stimulated at the end of the procedure and were intact.  Surgicel was then placed within the neurovascular beds.  Two #10 TLS drains were brought out the wound inferiorly.  The strap muscles were reapproximated in the midline using 4-0 Vicryl.  The platysma layer was closed using 4-0 Vicryl,  subcutaneous layer was closed using 4-0 Vicryl and the skin was closed using Dermabond.  The patient was then returned to Anesthesia where she was awakened in the operating room and taken to the recovery room in stable condition.  CULTURES:  None.  SPECIMENS:  Total thyroid.  ESTIMATED BLOOD LOSS:  Less than 20 cc.          ______________________________ Roena Malady, MD     CTM/MEDQ  D:  01/01/2015  T:  01/01/2015  Job:  [id number]

## 2015-01-01 NOTE — Anesthesia Procedure Notes (Signed)
Procedure Name: Intubation Date/Time: 01/01/2015 8:53 AM Performed by: Jonna Clark Pre-anesthesia Checklist: Patient identified, Emergency Drugs available, Suction available, Patient being monitored and Timeout performed Patient Re-evaluated:Patient Re-evaluated prior to inductionOxygen Delivery Method: Circle system utilized Preoxygenation: Pre-oxygenation with 100% oxygen Intubation Type: IV induction Ventilation: Mask ventilation without difficulty Laryngoscope Size: Mac and 3 Grade View: Grade II Tube type: Oral Number of attempts: 1 Placement Confirmation: ETT inserted through vocal cords under direct vision,  positive ETCO2 and breath sounds checked- equal and bilateral Secured at: 22 cm Tube secured with: Tape Dental Injury: Teeth and Oropharynx as per pre-operative assessment

## 2015-01-01 NOTE — OR Nursing (Signed)
Family updated @ [phone]

## 2015-01-01 NOTE — Progress Notes (Signed)
01/01/2015 5:46 PM  Lindsay Ramos [id number]  Post-Op Day 0    Temp:  [97.5 F (36.4 C)-99.4 F (37.4 C)] 98.1 F (36.7 C) (05/10 1632) Pulse Rate:  [80-129] 101 (05/10 1634) Resp:  [0-26] 17 (05/10 1616) BP: (113-144)/(75-103) 113/78 mmHg (05/10 1634) SpO2:  [89 %-100 %] 95 % (05/10 1634),     Intake/Output Summary (Last 24 hours) at 01/01/15 1746 Last data filed at 01/01/15 1710  Gross per 24 hour  Intake   1952 ml  Output    625 ml  Net   1327 ml    Results for orders placed or performed during the hospital encounter of 01/01/15 (from the past 24 hour(s))  Pregnancy, urine POC     Status: None   Collection Time: 01/01/15  7:12 AM  Result Value Ref Range   Preg Test, Ur NEGATIVE NEGATIVE  Calcium     Status: None   Collection Time: 01/01/15  1:15 PM  Result Value Ref Range   Calcium 8.9 8.9 - 10.3 mg/dL  Calcium     Status: None   Collection Time: 01/01/15  4:21 PM  Result Value Ref Range   Calcium 8.9 8.9 - 10.3 mg/dL    SUBJECTIVE:  Feeling some nausea but not much pain.  OBJECTIVE:  Neck looks great, voice OK, Calcium stable, drains ok  IMPRESSION:  S/p thyroidectomy, will likely dc in am if ca stable  PLAN:  Advance diet overnight.  oobed with assistance.    [name] 01/01/2015, 5:46 PM

## 2015-01-01 NOTE — Anesthesia Postprocedure Evaluation (Signed)
  Anesthesia Post-op Note  Patient: Lindsay Ramos  Procedure(s) Performed: Procedure(s): THYROIDECTOMY, total with laryngeal nerve monitoring  (N/A)  Anesthesia type:General  Patient location: PACU  Post pain: Pain level controlled  Post assessment: Post-op Vital signs reviewed, Patient's Cardiovascular Status Stable, Respiratory Function Stable, Patent Airway and No signs of Nausea or vomiting  Post vital signs: Reviewed and stable  Last Vitals:  Filed Vitals:   01/01/15 1038  BP: 137/97  Pulse: 124  Temp: 37 C  Resp: 25    Level of consciousness: awake, alert  and patient cooperative  Complications: No apparent anesthesia complications

## 2015-01-02 DIAGNOSIS — C73 Malignant neoplasm of thyroid gland: Secondary | ICD-10-CM | POA: Diagnosis not present

## 2015-01-02 LAB — SURGICAL PATHOLOGY

## 2015-01-02 LAB — CALCIUM: Calcium: 8.7 mg/dL — ABNORMAL LOW (ref 8.9–10.3)

## 2015-01-02 MED ORDER — HYDROCODONE-ACETAMINOPHEN 5-325 MG PO TABS: 1.0000 | ORAL_TABLET | ORAL | Status: DC | PRN

## 2015-01-02 MED ORDER — ONDANSETRON HCL 4 MG PO TABS: 4.0000 mg | ORAL_TABLET | ORAL | Status: DC | PRN

## 2015-01-02 NOTE — Progress Notes (Signed)
Pt A&OX4. IVF's infusing without difficulty. Surgical site with T tubes x2, changed at [place]. Pt with pain rated 6/10. Prn tylenol given per M.D. Order. Pt tolerating liquid and soft foods, will advance diet in the am. Pt voiding and ambulating around unit . Tolerated well. Husband at the bedside for support.

## 2015-01-02 NOTE — Discharge Summary (Signed)
Physician Discharge Summary  Patient ID: Lindsay Ramos MRN: [id number] DOB/AGE: 18-Apr-1991 23 y.o.  Admit date: 01/01/2015 Discharge date: 01/02/2015  Admission Diagnoses:  Discharge Diagnoses:  Active Problems:   Thyroid carcinoma   Discharged Condition: good  Hospital Course: doing well post op, drains removed.  Ca stable 8.9 and 8.7, will continue Ca at home.    Consults: None  Significant Diagnostic Studies: labs: none  Treatments: surgery: Total thyroidectomy  Discharge Exam: Blood pressure 124/85, pulse 118, temperature 98.2 F (36.8 C), temperature source Oral, resp. rate 18, height 5\' 7"  (1.702 m), weight 88.905 kg (196 lb), last menstrual period 12/23/2014, SpO2 99 %. Neck: no adenopathy, no carotid bruit, no JVD, supple, symmetrical, trachea midline, thyroid not enlarged, symmetric, no tenderness/mass/nodules and wound looks great, drains removed.  Disposition:      Medication List    TAKE these medications        calcium carbonate 1250 (500 CA) MG tablet  Commonly known as:  OS-CAL - dosed in mg of elemental calcium  Take 2 tablets by mouth 2 (two) times daily with a meal.     HYDROcodone-acetaminophen 5-325 MG per tablet  Commonly known as:  NORCO/VICODIN  Take 1-2 tablets by mouth every 4 (four) hours as needed for moderate pain.     norethindrone-ethinyl estradiol 1-20 MG-MCG tablet  Commonly known as:  JUNEL FE,GILDESS FE,LOESTRIN FE  Take 1 tablet by mouth daily.     ondansetron 4 MG tablet  Commonly known as:  ZOFRAN  Take 1 tablet (4 mg total) by mouth every 4 (four) hours as needed for nausea.         SignedBeverly Gust T 01/02/2015, 8:05 AM

## 2015-01-02 NOTE — Discharge Instructions (Signed)
Surgical Site Infections FAQs  What is a Surgical Site Infection (SSI)?  A surgical site infection is an infection that occurs after surgery in the part of the body where the surgery took place. Most patients who have surgery do not develop an infection. However, infections develop in about 1 to 3 out of every 100 patients who have surgery.  Some of the common symptoms of a surgical site infection are:   Redness and pain around the area where you had surgery   Drainage of cloudy fluid from your surgical wound   Fever  Can SSIs be treated?  Yes. Most surgical site infections can be treated with antibiotics. The antibiotic given to you depends on the bacteria (germs) causing the infections. Sometimes patients with SSIs also need another surgery to treat the infection.  What are some of the things that hospitals are doing to prevent SSIs?  To prevent SSIs, doctors, nurses, and other healthcare providers:   Clean their hands and arms up to their elbows with an antiseptic agent just before the surgery.   Clean their hands with soap and water or an alcohol-based hand rub before and after caring for each patient.   May remove some of your hair immediately before your surgery using electric clippers if the hair is in the same area where the procedure will occur. They should not shave you with a razor.   Wear special hair covers, masks, gowns, and gloves during surgery to keep the surgery area clean.   Give you antibiotics before your surgery starts. In most cases, you should get antibiotics within 60 minutes before the surgery starts and the antibiotics should be stopped within 24 hours after surgery.   Clean the skin at the site of your surgery with a special soap that kills germs.  What can I do to help prevent SSIs?  Before your surgery:   Tell your doctor about other medical problems you may have. Health problems such as allergies, diabetes, and obesity could affect your surgery and your treatment.   Quit  smoking. Patients who smoke get more infections. Talk to your doctor about how you can quit before your surgery.   Do not shave near where you will have surgery. Shaving with a razor can irritate your skin and make it easier to develop an infection.  At the time of your surgery:   Speak up if someone tries to shave you with a razor before surgery. Ask why you need to be shaved and talk with your surgeon if you have any concerns.   Ask if you will get antibiotics before surgery.  After your surgery:   Make sure that your healthcare providers clean their hands before examining you, either with soap and water or an alcohol-based hand rub.   If you do not see your providers clean their hands, please ask them to do so.   Family and friends who visit you should not touch the surgical wound or dressings.   Family and friends should clean their hands with soap and water or an alcohol-based hand rub before and after visiting you. If you do not see them clean their hands, ask them to clean their hands.  What do I need to do when I go home from the hospital?   Before you go home, your doctor or nurse should explain everything you need to know about taking care of your wound. Make sure you understand how to care for your wound before you leave the hospital.     Always clean your hands before and after caring for your wound.   Before you go home, make sure you know who to contact if you have questions or problems after you get home.   If you have any symptoms of an infection, such as redness and pain at the surgery site, drainage, or fever, call your doctor immediately.  If you have additional questions, please ask your doctor or nurse.  Developed and co-sponsored by The Society for Healthcare Epidemiology of America (SHEA); Infectious Diseases Society of America (IDSA); American Hospital Association; Association for Professionals in Infection Control and Epidemiology (APIC); Centers for Disease Control and Prevention  (CDC); and The Joint Commission.  Document Released: 08/15/2013 Document Reviewed: 08/15/2013  ExitCare Patient Information 2015 [employer]. This information is not intended to replace advice given to you by your health care provider. Make sure you discuss any questions you have with your health care provider.

## 2015-01-02 NOTE — Progress Notes (Signed)
Pt received discharge orders. Instructions were reviewed with pt with all questions answered. IV removed with dressing dry and intact. Pt discharged via wheelchair.

## 2015-01-06 ENCOUNTER — Encounter: Payer: Self-pay | Admitting: Unknown Physician Specialty

## 2015-01-08 ENCOUNTER — Other Ambulatory Visit: Payer: Self-pay | Admitting: Oncology

## 2015-01-08 ENCOUNTER — Inpatient Hospital Stay: Payer: BLUE CROSS/BLUE SHIELD | Attending: Oncology | Admitting: Oncology

## 2015-01-08 ENCOUNTER — Other Ambulatory Visit: Payer: Self-pay | Admitting: *Deleted

## 2015-01-08 ENCOUNTER — Inpatient Hospital Stay: Payer: BLUE CROSS/BLUE SHIELD

## 2015-01-08 VITALS — BP 131/90 | HR 118 | Temp 98.2°F | Wt 193.1 lb

## 2015-01-08 DIAGNOSIS — Z79899 Other long term (current) drug therapy: Secondary | ICD-10-CM | POA: Diagnosis not present

## 2015-01-08 DIAGNOSIS — C73 Malignant neoplasm of thyroid gland: Secondary | ICD-10-CM | POA: Insufficient documentation

## 2015-01-08 DIAGNOSIS — R531 Weakness: Secondary | ICD-10-CM

## 2015-01-08 DIAGNOSIS — R5383 Other fatigue: Secondary | ICD-10-CM | POA: Diagnosis not present

## 2015-01-08 DIAGNOSIS — C779 Secondary and unspecified malignant neoplasm of lymph node, unspecified: Secondary | ICD-10-CM | POA: Insufficient documentation

## 2015-01-08 LAB — TSH: TSH: 7.321 u[IU]/mL — ABNORMAL HIGH (ref 0.350–4.500)

## 2015-01-08 MED ORDER — LEVOTHYROXINE SODIUM 50 MCG PO TABS: 50.0000 ug | ORAL_TABLET | Freq: Every day | ORAL | Status: DC

## 2015-01-08 MED ORDER — BENZONATATE 100 MG PO CAPS: 100.0000 mg | ORAL_CAPSULE | Freq: Three times a day (TID) | ORAL | Status: DC

## 2015-01-08 NOTE — Addendum Note (Signed)
Addended by: Betti Cruz on: 01/08/2015 12:42 PM   Modules accepted: Orders

## 2015-01-08 NOTE — Progress Notes (Signed)
Pt had thyroidectomy on 5-10. Since that time she has had chest congestion and cough.  Cough wakes her up in the night.  Started low iodine diet on Thursday.

## 2015-01-09 ENCOUNTER — Encounter: Payer: Self-pay | Admitting: Oncology

## 2015-01-09 LAB — THYROGLOBULIN ANTIBODY

## 2015-01-09 LAB — T4: T4 TOTAL: 6 ug/dL (ref 4.5–12.0)

## 2015-01-09 NOTE — Progress Notes (Signed)
[place] @ [facility] [phone]) [phone]  Fax:(336) [place]: 1991-08-15  MR#: [id number]  OZD#:[id number]  Patient Care Team: Gayland Curry, MD as PCP - General (Family Medicine)  CHIEF COMPLAINT:  Chief Complaint  Patient presents with  . Follow-up    Oncology History   1.  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  Patient had multiple nodules.  One tumor was 2.5 cm and 2.5 cm x 1.9 cm extending to the lower right lobe.  The second tumor was 0.6 cm in right lower lobe.  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     Thyroid carcinoma   01/01/2015 Initial Diagnosis Thyroid carcinoma    Oncology Flowsheet 01/01/2015 01/01/2015  dexamethasone (DECADRON) IJ -    diphenhydrAMINE (BENADRYL) PO 25 mg    ondansetron (ZOFRAN) IV - 4 mg    INTERVAL HISTORY: 24 year old lady with a history of carcinoma of thyroid status post total thyroidectomy here for further evaluation and treatment consideration.  Patient has stage I disease.  (Patient's below age of 33) metastases to lymph node.  Gradually improving feeling somewhat weak and tired.  REVIEW OF SYSTEMS:   GENERAL:  Feels good.  Active.  No fevers, sweats or weight loss. PERFORMANCE STATUS (ECOG):  01 HEENT:  No visual changes, runny nose, sore throat, mouth sores or tenderness. Lungs: No shortness of breath or cough.  No hemoptysis. Cardiac:  No chest pain, palpitations, orthopnea, or PND. GI:  No nausea, vomiting, diarrhea, constipation, melena or hematochezia. GU:  No urgency, frequency, dysuria, or hematuria. Musculoskeletal:  No back pain.  No joint pain.  No muscle tenderness. Extremities:  No pain or swelling. Skin:  No rashes or skin changes. Neuro:  No headache, numbness or weakness, balance or coordination issues. Endocrine:  No diabetes, thyroid issues, hot  flashes or night sweats. Psych:  No mood changes, depression or anxiety. Pain:  No focal pain. Review of systems:  All other systems reviewed and found to be negative.  As per HPI. Otherwise, a complete review of systems is negatve.  PAST MEDICAL HISTORY: Past Medical History  Diagnosis Date  . Family history of adverse reaction to anesthesia     pts mom has stopped breathing   . Cancer     Thyroid    PAST SURGICAL HISTORY: Past Surgical History  Procedure Laterality Date  . Appendectomy    . Cesarean section    . Lymph node biopsy    . Thyroidectomy N/A 01/01/2015    Procedure: THYROIDECTOMY, total with laryngeal nerve monitoring ;  Surgeon: Beverly Gust, MD;  Location: [facility] ORS;  Service: ENT;  Laterality: N/A;    FAMILY HISTORY No significant past family history of ovarian cancer breast cancer or colon cancer GYNECOLOGIC HISTORY:  Patient's last menstrual period was 12/23/2014.     ADVANCED DIRECTIVES: No advance care directive patient did not want to get into discuss at present time   HEALTH MAINTENANCE: History  Substance Use Topics  . Smoking status: Never Smoker   . Smokeless tobacco: Not on file  . Alcohol Use: No      Allergies  Allergen Reactions  . Codeine Hives    Current Outpatient Prescriptions  Medication Sig Dispense Refill  . calcium carbonate (OS-CAL - DOSED IN MG OF ELEMENTAL CALCIUM) 1250 (500 CA) MG tablet Take 2 tablets by  mouth 2 (two) times daily with a meal.     . ibuprofen (ADVIL,MOTRIN) 800 MG tablet Take 800 mg by mouth 2 (two) times daily.    . benzonatate (TESSALON) 100 MG capsule Take 1 capsule (100 mg total) by mouth 3 (three) times daily. 30 capsule 0  . HYDROcodone-acetaminophen (NORCO/VICODIN) 5-325 MG per tablet Take 1-2 tablets by mouth every 4 (four) hours as needed for moderate pain. (Patient not taking: Reported on 01/08/2015) 30 tablet 0  . levothyroxine (SYNTHROID) 50 MCG tablet Take 1 tablet (50 mcg total) by mouth  daily before breakfast. 30 tablet 3  . norethindrone-ethinyl estradiol (JUNEL FE,GILDESS FE,LOESTRIN FE) 1-20 MG-MCG tablet Take 1 tablet by mouth daily.    . ondansetron (ZOFRAN) 4 MG tablet Take 1 tablet (4 mg total) by mouth every 4 (four) hours as needed for nausea. (Patient not taking: Reported on 01/08/2015) 20 tablet 0   No current facility-administered medications for this visit.    OBJECTIVE:  Filed Vitals:   01/08/15 1046  BP: 131/90  Pulse: 118  Temp: 98.2 F (36.8 C)     Body mass index is 30.24 kg/(m^2).    ECOG FS:0 - Asymptomatic  PHYSICAL EXAM: Gen. status: Slightly apprehensive and anxious lady not any acute distress HEENT: Thyroid surgery wound is healing well.  No palpable lymphadenopathy Examination of the chest was unremarkable. There were no bony deformities, no asymmetry, and no other abnormalities. Cardiac exam revealed the PMI to be normally situated and sized. The rhythm was regular and no extrasystoles were noted during several minutes of auscultation. The first and second heart sounds were normal and physiologic splitting of the second heart sound was noted. There were no murmurs, rubs, clicks, or gallops Abdominal exam revealed normal bowel sounds. The abdomen was soft, non-tender, and without masses, organomegaly, or appreciable enlargement of the abdominal aortaExamination of the skin revealed no evidence of significant rashes, suspicious appearing nevi or other concerning lesions Neurologically, the patient was awake, alert, and oriented to person, place and time. There were no obvious focal neurologic abnormalities.   LAB RESULTS:  Appointment on 01/08/2015  Component Date Value Ref Range Status  . T4, Total 01/08/2015 6.0  4.5 - 12.0 ug/dL Final   Comment: (NOTE) Performed At: [facility] [place], [place] [id number] Lindon Romp MD BH:[id number]   . TSH 01/08/2015 7.321* 0.350 - 4.500 uIU/mL Final  . Thyroglobulin  Antibody 01/08/2015 <1.0  0.0 - 0.9 IU/mL Final   Comment: (NOTE) Thyroglobulin Antibody measured by [facility] Methodology Performed At: [facility] [place], [place] [id number] Lindon Romp MD EQ:[id number]     No results found for: LABCA2 No results found for: CA199 No results found for: CEA No results found for: PSA No results found for: CA125   STUDIES: US Soft Tissue Head/neck  12/11/2014   CLINICAL DATA:  Goiter  EXAM: THYROID ULTRASOUND  TECHNIQUE: Ultrasound examination of the thyroid gland and adjacent soft tissues was performed.  COMPARISON:  None.  FINDINGS: Right thyroid lobe  Measurements: 5.4 x 2.6 x 2.2 cm. Heterogeneous upper pole nodule measures 3.3 x 2.2 x 2.1 cm. Punctate calcifications within the lesion are suggested.  Left thyroid lobe  Measurements: 4.9 x 1.6 x 1.1 cm.  No nodules visualized.  Isthmus  Thickness: 4 mm.  No nodules visualized.  Lymphadenopathy  None visualized.  IMPRESSION: Solitary right upper pole nodule measures 3.3 cm. Findings meet consensus criteria for biopsy. Ultrasound-guided fine needle  aspiration should be considered, as per the consensus statement: Management of Thyroid Nodules Detected at [place]: Society of Radiologists in [place]. Radiology 2005; [id number].   Electronically Signed   By: Marybelle Killings M.D.   On: 12/11/2014 16:24    ASSESSMENT: Patient with carcinoma thyroid multifocal largest lesion being 2.5 cm.  Positive lymph node   MEDICAL DECISION MAKING:  All the lab data has been reviewed.  We discussed findings as well as treatment regarding radioactive iodine I-131 ablation Pros and cones of such treatment considering patient's young age and fertility issue had been discussed. According to patient she does not want any more children at present time Long-term side effect of radioactive I-131 also has been discussed. Our options include radioactive iodine therapy  with Thyrogen intravenous versus radioactive iodine therapy after waiting 6 weeks with the iodine poor diet.  He patient feels extremely weak and tired then short-acting thyroid supplement can be given. TSH will be checked few days prior to planned radioactive iodine therapy  Patient expressed understanding and was in agreement with this plan. She also understands that She can call clinic at any time with any questions, concerns, or complaints.    Thyroid carcinoma   Staging form: Thyroid - Papillary or Follicular (Under 45 years), AJCC 7th Edition     Clinical: Stage I (T2(m), N1, M0) - Signed by Forest Gleason, MD on 01/08/2015   Forest Gleason, MD   01/09/2015 8:29 AM

## 2015-01-14 ENCOUNTER — Ambulatory Visit: Payer: BLUE CROSS/BLUE SHIELD

## 2015-01-29 ENCOUNTER — Ambulatory Visit: Payer: BLUE CROSS/BLUE SHIELD

## 2015-02-11 ENCOUNTER — Inpatient Hospital Stay: Payer: BLUE CROSS/BLUE SHIELD | Attending: Oncology

## 2015-02-11 ENCOUNTER — Telehealth: Payer: Self-pay | Admitting: *Deleted

## 2015-02-11 DIAGNOSIS — C73 Malignant neoplasm of thyroid gland: Secondary | ICD-10-CM | POA: Diagnosis not present

## 2015-02-11 LAB — HCG, QUANTITATIVE, PREGNANCY: hCG, Beta Chain, Quant, S: <1 m[IU]/mL (ref <5)

## 2015-02-11 LAB — TSH: TSH: 77.788 u[IU]/mL — AB (ref 0.350–4.500)

## 2015-02-11 NOTE — Telephone Encounter (Signed)
Called patient back regarding her questions. 1.  Are her labs within range to receive the iodine therapy tomorrow? Informed patient there is not a range instead the lab values are needed for dosing amounts of iodine therapy.  2.  How long does she have to stay on the iodine diet. Explained that question will be addressed in her discharge instructions tomorrow from nuclear medicine.  3.  When should she start her thyroid medication. Per Dr. Oliva Bustard patient can start synthroid on Thursday 6-23.  Patient verbalized understanding.

## 2015-02-12 ENCOUNTER — Ambulatory Visit
Admission: RE | Admit: 2015-02-12 | Discharge: 2015-02-12 | Disposition: A | Discharge status: Home / Self Care | Payer: BLUE CROSS/BLUE SHIELD | Source: Ambulatory Visit | Attending: Oncology | Admitting: Oncology

## 2015-02-12 DIAGNOSIS — C73 Malignant neoplasm of thyroid gland: Secondary | ICD-10-CM | POA: Diagnosis present

## 2015-02-12 MED ORDER — SODIUM IODIDE I 131 CAPSULE
100.0000 | Freq: Once | INTRAVENOUS | Status: AC | PRN
  Administered 2015-02-12: 109.3 via ORAL

## 2015-02-19 ENCOUNTER — Other Ambulatory Visit: Payer: BLUE CROSS/BLUE SHIELD

## 2015-02-19 ENCOUNTER — Ambulatory Visit: Payer: BLUE CROSS/BLUE SHIELD | Admitting: Oncology

## 2015-02-21 ENCOUNTER — Encounter
Admission: RE | Admit: 2015-02-21 | Discharge: 2015-02-21 | Disposition: A | Discharge status: Home / Self Care | Payer: BLUE CROSS/BLUE SHIELD | Source: Ambulatory Visit | Attending: Oncology | Admitting: Oncology

## 2015-02-21 DIAGNOSIS — C73 Malignant neoplasm of thyroid gland: Secondary | ICD-10-CM | POA: Insufficient documentation

## 2015-03-01 ENCOUNTER — Other Ambulatory Visit: Payer: Self-pay | Admitting: *Deleted

## 2015-03-01 DIAGNOSIS — C73 Malignant neoplasm of thyroid gland: Secondary | ICD-10-CM

## 2015-03-04 ENCOUNTER — Inpatient Hospital Stay: Payer: BLUE CROSS/BLUE SHIELD

## 2015-03-04 ENCOUNTER — Inpatient Hospital Stay: Payer: BLUE CROSS/BLUE SHIELD | Attending: Oncology | Admitting: Oncology

## 2015-03-04 ENCOUNTER — Encounter: Payer: Self-pay | Admitting: Oncology

## 2015-03-04 VITALS — BP 133/90 | HR 69 | Temp 99.3°F | Wt 197.8 lb

## 2015-03-04 DIAGNOSIS — Z79899 Other long term (current) drug therapy: Secondary | ICD-10-CM | POA: Insufficient documentation

## 2015-03-04 DIAGNOSIS — R5383 Other fatigue: Secondary | ICD-10-CM

## 2015-03-04 DIAGNOSIS — C779 Secondary and unspecified malignant neoplasm of lymph node, unspecified: Secondary | ICD-10-CM

## 2015-03-04 DIAGNOSIS — E890 Postprocedural hypothyroidism: Secondary | ICD-10-CM

## 2015-03-04 DIAGNOSIS — C73 Malignant neoplasm of thyroid gland: Secondary | ICD-10-CM | POA: Insufficient documentation

## 2015-03-04 LAB — TSH: TSH: 69.728 u[IU]/mL — ABNORMAL HIGH (ref 0.350–4.500)

## 2015-03-04 NOTE — Progress Notes (Signed)
[place] @ [facility] Telephone:(336) [phone]  Fax:(336) [place]: March 25, 1991  MR#: [id number]  YIA#:[id number]  Patient Care Team: Gayland Curry, MD as PCP - General (Family Medicine)  CHIEF COMPLAINT:  Chief Complaint  Patient presents with  . Follow-up    Oncology History   1.  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  Patient had multiple nodules.  One tumor was 2.5 cm and 2.5 cm x 1.9 cm extending to the lower right lobe.  The second tumor was 0.6 cm in right lower lobe.  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 2.  Patient had a radioactive iodine therapy in June of 2016     Thyroid carcinoma   01/01/2015 Initial Diagnosis Thyroid carcinoma    Oncology Flowsheet 01/01/2015 01/01/2015  dexamethasone (DECADRON) IJ -    diphenhydrAMINE (BENADRYL) PO 25 mg    ondansetron (ZOFRAN) IV - 4 mg    INTERVAL HISTORY: 24 year old lady with a history of carcinoma of thyroid status post total thyroidectomy here for further evaluation and treatment consideration.  Patient has stage I disease.  (Patient's below age of 61) metastases to lymph node.  Gradually improving feeling somewhat weak and tired. March 04, 2015 Patient is here for ongoing evaluation continues to feel weak and tired.  Had radioactive iodine therapy. On Synthroid 50 g.  Complains of tenderness in the throat.  REVIEW OF SYSTEMS:   GENERAL:  Feels good.  Active.  No fevers, sweats or weight loss. PERFORMANCE STATUS (ECOG):  0 HEENT:  No visual changes, runny nose, sore throat, mouth sores or tenderness. Lungs: No shortness of breath or cough.  No hemoptysis. Cardiac:  No chest pain, palpitations, orthopnea, or PND. GI:  No nausea, vomiting, diarrhea, constipation, melena or hematochezia. GU:  No urgency, frequency, dysuria, or hematuria. Musculoskeletal:  No  back pain.  No joint pain.  No muscle tenderness. Extremities:  No pain or swelling. Skin:  No rashes or skin changes. Neuro:  No headache, numbness or weakness, balance or coordination issues. Endocrine:  No diabetes, thyroid issues, hot flashes or night sweats. Psych:  No mood changes, depression or anxiety. Pain:  No focal pain. Review of systems:  All other systems reviewed and found to be negative.  As per HPI. Otherwise, a complete review of systems is negatve.  PAST MEDICAL HISTORY: Past Medical History  Diagnosis Date  . Family history of adverse reaction to anesthesia     pts mom has stopped breathing   . Cancer     Thyroid    PAST SURGICAL HISTORY: Past Surgical History  Procedure Laterality Date  . Appendectomy    . Cesarean section    . Lymph node biopsy    . Thyroidectomy N/A 01/01/2015    Procedure: THYROIDECTOMY, total with laryngeal nerve monitoring ;  Surgeon: Beverly Gust, MD;  Location: [facility] ORS;  Service: ENT;  Laterality: N/A;    FAMILY HISTORY No significant past family history of ovarian cancer breast cancer or colon cancer      ADVANCED DIRECTIVES: No advance care directive patient did not want to get into discuss at present time   HEALTH MAINTENANCE: History  Substance Use Topics  . Smoking status: Never Smoker   . Smokeless tobacco: Not on file  . Alcohol Use: No      Allergies  Allergen Reactions  .  Codeine Hives    Current Outpatient Prescriptions  Medication Sig Dispense Refill  . benzonatate (TESSALON) 100 MG capsule Take 1 capsule (100 mg total) by mouth 3 (three) times daily. 30 capsule 0  . calcium carbonate (OS-CAL - DOSED IN MG OF ELEMENTAL CALCIUM) 1250 (500 CA) MG tablet Take 2 tablets by mouth 2 (two) times daily with a meal.     . HYDROcodone-acetaminophen (NORCO/VICODIN) 5-325 MG per tablet Take 1-2 tablets by mouth every 4 (four) hours as needed for moderate pain. 30 tablet 0  . ibuprofen (ADVIL,MOTRIN) 800 MG tablet  Take 800 mg by mouth 2 (two) times daily.    Marland Kitchen levothyroxine (SYNTHROID) 50 MCG tablet Take 1 tablet (50 mcg total) by mouth daily before breakfast. 30 tablet 3  . norethindrone-ethinyl estradiol (JUNEL FE,GILDESS FE,LOESTRIN FE) 1-20 MG-MCG tablet Take 1 tablet by mouth daily.    . ondansetron (ZOFRAN) 4 MG tablet Take 1 tablet (4 mg total) by mouth every 4 (four) hours as needed for nausea. 20 tablet 0  . MICROGESTIN 1.5-30 MG-MCG tablet TK 1 T PO  DAILY  6   No current facility-administered medications for this visit.    OBJECTIVE:  Filed Vitals:   03/04/15 1214  BP: 133/90  Pulse: 69  Temp: 99.3 F (37.4 C)     Body mass index is 30.97 kg/(m^2).    ECOG FS:0 - Asymptomatic  PHYSICAL EXAM: Gen. status: Slightly apprehensive and anxious lady not any acute distress HEENT: Thyroid surgery wound is healing well.  No palpable lymphadenopathy Examination of the chest was unremarkable. There were no bony deformities, no asymmetry, and no other abnormalities. Cardiac exam revealed the PMI to be normally situated and sized. The rhythm was regular and no extrasystoles were noted during several minutes of auscultation. The first and second heart sounds were normal and physiologic splitting of the second heart sound was noted. There were no murmurs, rubs, clicks, or gallops Abdominal exam revealed normal bowel sounds. The abdomen was soft, non-tender, and without masses, organomegaly, or appreciable enlargement of the abdominal aortaExamination of the skin revealed no evidence of significant rashes, suspicious appearing nevi or other concerning lesions Neurologically, the patient was awake, alert, and oriented to person, place and time. There were no obvious focal neurologic abnormalities.   LAB RESULTS:  Appointment on 03/04/2015  Component Date Value Ref Range Status  . 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 Largest carcinoma nodule measuring 2.5 cm. One of 1 lymph nodes positive for metastatic disease. (T2 N1). Patient presents for remnant ablation and adjuvant therapy. Patient status post thyroidectomy. Tsh equal 78  EXAM: RADIOACTIVE IODINE THERAPY FOR THYROID CANCER  PROCEDURE: The risks and benefits of radioactive iodine therapy were discussed with the patient in detail. Alternative therapies were also mentioned. Radiation safety was discussed with the patient, including how to protect the general public from exposure. There were no barriers to communication. Written consent was obtained. The patient then received a capsule containing the radiopharmaceutical. Prescribing physician (Dr. Leonia Reeves. Administration by Dr. Golden Circle.  The patient will follow-up with the referring physician.  RADIOPHARMACEUTICALS:  109 mCi I-131 sodium iodide  FINDINGS: 24 year old female status post thyroidectomy with T2  N1 disease. Patient presents for remnant ablation adjuvant therapy.  IMPRESSION: Per oral administration of I-131 sodium iodide for remnant ablation and adjuvant therapy for thyroid carcinoma.   Electronically Signed   By: Suzy Bouchard M.D.   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 T2 N1 disease. 1 of 1 lymph nodes positive. No extra thyroid extension. No lymphovascular invasion. Negative margins. Patient status post adjuvant therapy and remnant ablation 02/12/2015.  EXAM: NUCLEAR MEDICINE I-131 POST THERAPY WHOLE BODY SCAN  TECHNIQUE: The patient received 109 mCi I-131 sodium iodide for the treatment of thyroid cancer within the past 10 days. The patient returns today, and whole body scanning was  performed in the anterior and posterior projections.  COMPARISON:  None.  FINDINGS: There are 2 foci of uptake within the thyroid bed. One midline in the expected location of the thyroid gland. One larger focus slightly inferior to the thyroid bed at the level of the thoracic inlet.  There is faint physiologic uptake within the liver which is typical. There is faint diffuse uptake within the lungs which is atypical.  IMPRESSION: 1. Two foci of uptake within the thyroid bed are likely a combination of residual thyroid tissue and lymph node metastasis versus ectopic thyroid tissue. 2. Faint diffuse intrathoracic uptake is nonspecific. Differential includes false-positive uptake (potential inflammation) versus unlikely micro metastasis to the lungs. Recommend following thyroglobulin levels and consider repeat I 131 scan. A CT scan of the thorax could be considered for baseline evaluation. 3. No additional evidence of distant metastatic disease.  Findings conveyed toJANAK Haydin Calandra on 02/21/2015  at13:40.   Electronically Signed   By: Suzy Bouchard M.D.   On: 02/21/2015 13:41    ASSESSMENT: Patient with carcinoma thyroid multifocal largest lesion being 2.5 cm.  Positive lymph node Status post radioactive iodine therapy   MEDICAL DECISION MAKING:  All lab data has been reviewed.  TSH continues to be high.  We will increase Synthroid 100 mcg  Recheck T4 TSH. I will also had prolonged discussion with Dr. Suzy Bouchard and post-radioactive iodine scan revealed some increased uptake in the lungs and he wanted to be sure with another CT scan of the lungs to rule out any issue with metastases.  I discussed the findings with the patient.  Patient and family desire scan to be done in September.  Patient will be reevaluated after CT scan is available Patient expressed understanding and was in agreement with this plan. She also understands that She can call clinic at any time with any questions, concerns, or  complaints.    Thyroid carcinoma   Staging form: Thyroid - Papillary or Follicular (Under 45 years), AJCC 7th Edition     Clinical: Stage I (T2(m), N1, M0) - Signed by Forest Gleason, MD on 01/08/2015   Forest Gleason, MD   03/04/2015 8:16 PM

## 2015-03-04 NOTE — Progress Notes (Signed)
Patient does not have living will.  Information given previously.  Patient would like progress note sent to Dr. Tami Ribas. 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 Cannot taste salt at all.  Increased salivation.

## 2015-03-05 ENCOUNTER — Telehealth: Payer: Self-pay | Admitting: *Deleted

## 2015-03-05 DIAGNOSIS — C73 Malignant neoplasm of thyroid gland: Secondary | ICD-10-CM

## 2015-03-05 MED ORDER — LEVOTHYROXINE SODIUM 100 MCG PO TABS: 100.0000 ug | ORAL_TABLET | Freq: Every day | ORAL | Status: DC

## 2015-03-05 NOTE — Telephone Encounter (Signed)
-----   Message from Forest Gleason, MD sent at 03/04/2015  8:19 PM EDT ----- Regarding: Increase Synthroid Increased dose of Synthroid 100 g daily. Keep appointment as before in September for a recheck in T4 TSH and thyroglobulin along with CT scan

## 2015-03-05 NOTE — Telephone Encounter (Signed)
Called patient and left message to inform her that MD is increasing her synthroid to 100 mcg.  Prescription has been called to her pharmacy.  Patient is to keep next scheduled appointment.

## 2015-03-05 NOTE — Telephone Encounter (Signed)
New rx for 158mcg synthroid sent to pharmacy.

## 2015-03-10 LAB — T4: T4, Total: 4.8 ug/dL (ref 4.5–12.0)

## 2015-03-10 LAB — THYROGLOBULIN LEVEL: THYROGLOBULIN: 21 ng/mL

## 2015-03-12 ENCOUNTER — Other Ambulatory Visit: Payer: BLUE CROSS/BLUE SHIELD

## 2015-03-12 ENCOUNTER — Ambulatory Visit: Payer: BLUE CROSS/BLUE SHIELD | Admitting: Oncology

## 2015-03-19 ENCOUNTER — Other Ambulatory Visit: Payer: BLUE CROSS/BLUE SHIELD

## 2015-03-19 ENCOUNTER — Ambulatory Visit: Payer: BLUE CROSS/BLUE SHIELD | Admitting: Oncology

## 2015-05-01 ENCOUNTER — Ambulatory Visit
Admission: RE | Admit: 2015-05-01 | Discharge: 2015-05-01 | Disposition: A | Discharge status: Home / Self Care | Payer: 59 | Source: Ambulatory Visit | Attending: Oncology | Admitting: Oncology

## 2015-05-01 ENCOUNTER — Inpatient Hospital Stay: Payer: 59 | Attending: Oncology

## 2015-05-01 DIAGNOSIS — Z79899 Other long term (current) drug therapy: Secondary | ICD-10-CM | POA: Insufficient documentation

## 2015-05-01 DIAGNOSIS — C73 Malignant neoplasm of thyroid gland: Secondary | ICD-10-CM | POA: Diagnosis not present

## 2015-05-01 DIAGNOSIS — F419 Anxiety disorder, unspecified: Secondary | ICD-10-CM | POA: Insufficient documentation

## 2015-05-01 DIAGNOSIS — Z85850 Personal history of malignant neoplasm of thyroid: Secondary | ICD-10-CM | POA: Diagnosis not present

## 2015-05-01 DIAGNOSIS — Z08 Encounter for follow-up examination after completed treatment for malignant neoplasm: Secondary | ICD-10-CM | POA: Insufficient documentation

## 2015-05-01 DIAGNOSIS — Z923 Personal history of irradiation: Secondary | ICD-10-CM | POA: Insufficient documentation

## 2015-05-01 LAB — TSH: TSH: 10.701 u[IU]/mL — ABNORMAL HIGH (ref 0.350–4.500)

## 2015-05-01 LAB — CREATININE, SERUM
Creatinine, Ser: 0.99 mg/dL (ref 0.44–1.00)
GFR calc non Af Amer: >60 mL/min (ref >60)

## 2015-05-01 MED ORDER — IOHEXOL 300 MG/ML  SOLN
75.0000 mL | Freq: Once | INTRAMUSCULAR | Status: AC | PRN
  Administered 2015-05-01: 75 mL via INTRAVENOUS

## 2015-05-02 ENCOUNTER — Encounter (INDEPENDENT_AMBULATORY_CARE_PROVIDER_SITE_OTHER): Payer: Self-pay

## 2015-05-02 ENCOUNTER — Inpatient Hospital Stay (HOSPITAL_BASED_OUTPATIENT_CLINIC_OR_DEPARTMENT_OTHER): Payer: 59 | Admitting: Oncology

## 2015-05-02 VITALS — BP 124/91 | HR 84 | Temp 99.0°F | Wt 200.2 lb

## 2015-05-02 DIAGNOSIS — F419 Anxiety disorder, unspecified: Secondary | ICD-10-CM

## 2015-05-02 DIAGNOSIS — Z923 Personal history of irradiation: Secondary | ICD-10-CM | POA: Diagnosis not present

## 2015-05-02 DIAGNOSIS — Z79899 Other long term (current) drug therapy: Secondary | ICD-10-CM

## 2015-05-02 DIAGNOSIS — C73 Malignant neoplasm of thyroid gland: Secondary | ICD-10-CM

## 2015-05-02 LAB — T4: T4, Total: 8 ug/dL (ref 4.5–12.0)

## 2015-05-02 MED ORDER — LEVOTHYROXINE SODIUM 125 MCG PO TABS: 125.0000 ug | ORAL_TABLET | Freq: Every day | ORAL | Status: DC

## 2015-05-02 MED ORDER — LEVOTHYROXINE SODIUM 25 MCG PO TABS: 25.0000 ug | ORAL_TABLET | Freq: Every day | ORAL | Status: DC

## 2015-05-02 NOTE — Progress Notes (Signed)
Patient here for CT results.  

## 2015-05-03 ENCOUNTER — Encounter: Payer: Self-pay | Admitting: Oncology

## 2015-05-03 NOTE — Progress Notes (Signed)
[place] @ [facility] Telephone:(336) [phone]  Fax:(336) [place]: 1991-02-03  MR#: [id number]  DEY#:[id number]  Patient Care Team: Gayland Curry, MD as PCP - General (Family Medicine) Beverly Gust, MD (Unknown Physician Specialty)  CHIEF COMPLAINT:  Chief Complaint  Patient presents with  . Results   patient is here for further follow-up regarding carcinoma of the thyroid as well as discussing the results of the CT scan of the chest Oncology History   1.  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  Patient had multiple nodules.  One tumor was 2.5 cm and 2.5 cm x 1.9 cm extending to the lower right lobe.  The second tumor was 0.6 cm in right lower lobe.  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 2.  Patient had a radioactive iodine therapy in June of 2016        Oncology Flowsheet 01/01/2015 01/01/2015  dexamethasone (DECADRON) IJ -    diphenhydrAMINE (BENADRYL) PO 25 mg    ondansetron (ZOFRAN) IV - 4 mg    INTERVAL HISTORY: 24 year old lady with a history of carcinoma of thyroid status post total thyroidectomy here for further evaluation and treatment consideration.  Patient has stage I disease.  (Patient's below age of 52) metastases to lymph node.  Gradually improving feeling somewhat weak and tired.  She is very apprehensive.  Taking 100 g of Synthroid.  Because of some abnormality on PET scan a chest CT scan has been ordered.  Patient is here with her family to discuss the results of the PET scan. Somewhat tired.  Otherwise remains  asymptomatic  REVIEW OF SYSTEMS:   GENERAL:  Feels good.  Active.  No fevers, sweats or weight loss. PERFORMANCE STATUS (ECOG):  0 HEENT:  No visual changes, runny nose, sore throat, mouth sores or tenderness. Lungs: No shortness of breath or cough.  No hemoptysis. Cardiac:  No chest  pain, palpitations, orthopnea, or PND. GI:  No nausea, vomiting, diarrhea, constipation, melena or hematochezia. GU:  No urgency, frequency, dysuria, or hematuria. Musculoskeletal:  No back pain.  No joint pain.  No muscle tenderness. Extremities:  No pain or swelling. Skin:  No rashes or skin changes. Neuro:  No headache, numbness or weakness, balance or coordination issues. Endocrine:  No diabetes, thyroid issues, hot flashes or night sweats. Psych:  No mood changes, depression or anxiety. Pain:  No focal pain. Review of systems:  All other systems reviewed and found to be negative.  As per HPI. Otherwise, a complete review of systems is negatve.  PAST MEDICAL HISTORY: Past Medical History  Diagnosis Date  . Family history of adverse reaction to anesthesia     pts mom has stopped breathing   . Cancer     Thyroid    PAST SURGICAL HISTORY: Past Surgical History  Procedure Laterality Date  . Appendectomy    . Cesarean section    . Lymph node biopsy    . Thyroidectomy N/A 01/01/2015    Procedure: THYROIDECTOMY, total with laryngeal nerve monitoring ;  Surgeon: Beverly Gust, MD;  Location: [facility] ORS;  Service: ENT;  Laterality: N/A;    FAMILY HISTORY No significant past family history of ovarian cancer breast cancer or colon cancer      ADVANCED DIRECTIVES: No advance care directive patient did not want to get into discuss at present time  HEALTH MAINTENANCE: Social History  Substance Use Topics  . Smoking status: Never Smoker   . Smokeless tobacco: None  . Alcohol Use: No      Allergies  Allergen Reactions  . Codeine Hives    Current Outpatient Prescriptions  Medication Sig Dispense Refill  . benzonatate (TESSALON) 100 MG capsule Take 1 capsule (100 mg total) by mouth 3 (three) times daily. 30 capsule 0  . calcium carbonate (OS-CAL - DOSED IN MG OF ELEMENTAL CALCIUM) 1250 (500 CA) MG tablet Take 2 tablets by mouth 2 (two) times daily with a meal.     .  HYDROcodone-acetaminophen (NORCO/VICODIN) 5-325 MG per tablet Take 1-2 tablets by mouth every 4 (four) hours as needed for moderate pain. 30 tablet 0  . ibuprofen (ADVIL,MOTRIN) 800 MG tablet Take 800 mg by mouth 2 (two) times daily.    Marland Kitchen levothyroxine (SYNTHROID) 100 MCG tablet Take 1 tablet (100 mcg total) by mouth daily before breakfast. 60 tablet 6  . levothyroxine (SYNTHROID) 125 MCG tablet Take 1 tablet (125 mcg total) by mouth daily before breakfast. Start when finishes 1102mg and 25 mcg tablets. 30 tablet 6  . levothyroxine (SYNTHROID, LEVOTHROID) 25 MCG tablet Take 1 tablet (25 mcg total) by mouth daily before breakfast. Take with levothyroxine 1013m for total dose of 12576m 30 tablet 0  . MICROGESTIN 1.5-30 MG-MCG tablet TK 1 T PO  DAILY  6  . norethindrone-ethinyl estradiol (JUNEL FE,GILDESS FE,LOESTRIN FE) 1-20 MG-MCG tablet Take 1 tablet by mouth daily.    . ondansetron (ZOFRAN) 4 MG tablet Take 1 tablet (4 mg total) by mouth every 4 (four) hours as needed for nausea. 20 tablet 0   No current facility-administered medications for this visit.    OBJECTIVE:  Filed Vitals:   05/02/15 1617  BP: 124/91  Pulse: 84  Temp: 99 F (37.2 C)     Body mass index is 31.36 kg/(m^2).    ECOG FS:0 - Asymptomatic  PHYSICAL EXAM: Gen. status: Slightly apprehensive and anxious lady not any acute distress HEENT: Thyroid surgery wound is healing well.  No palpable lymphadenopathy Examination of the chest was unremarkable. There were no bony deformities, no asymmetry, and no other abnormalities. Cardiac exam revealed the PMI to be normally situated and sized. The rhythm was regular and no extrasystoles were noted during several minutes of auscultation. The first and second heart sounds were normal and physiologic splitting of the second heart sound was noted. There were no murmurs, rubs, clicks, or gallops Abdominal exam revealed normal bowel sounds. The abdomen was soft, non-tender, and without  masses, organomegaly, or appreciable enlargement of the abdominal aortaExamination of the skin revealed no evidence of significant rashes, suspicious appearing nevi or other concerning lesions Neurologically, the patient was awake, alert, and oriented to person, place and time. There were no obvious focal neurologic abnormalities.   LAB RESULTS:  Appointment on 05/01/2015  Component Date Value Ref Range Status  . Creatinine, Ser 05/01/2015 0.99  0.44 - 1.00 mg/dL Final  . GFR calc non Af Amer 05/01/2015 >60  >60 mL/min Final  . GFR calc Af Amer 05/01/2015 >60  >60 mL/min Final   Comment: (NOTE) The eGFR has been calculated using the CKD EPI equation. This calculation has not been validated in all clinical situations. eGFR's persistently <60 mL/min signify possible Chronic Kidney Disease.   . T4, Total 05/01/2015 8.0  4.5 - 12.0 ug/dL Final   Comment: (NOTE) Performed At: [facility]  [id number] Lindon Romp MD [id number]   . TSH 05/01/2015 10.701* 0.350 - 4.500 uIU/mL Final    No results found for: LABCA2 No results found for: CA199 No results found for: CEA No results found for: PSA No results found for: CA125   STUDIES: Ct Chest W Contrast  05/01/2015   CLINICAL DATA:  Thyroid cancer post thyroidectomy and postoperative radioactive iodine therapy utilizing 109 mCi of I-131 sodium iodide orally. Abnormal posttherapy whole-body scan showing pulmonary uptake of tracer versus artifact  EXAM: CT CHEST WITH CONTRAST  TECHNIQUE: Multidetector CT imaging of the chest was performed during intravenous contrast administration. Sagittal and coronal MPR images reconstructed from axial data set.  CONTRAST:  4m OMNIPAQUE IOHEXOL 300 MG/ML  SOLN IV  COMPARISON:  None; correlation whole-body iodine scan 02/21/2015  FINDINGS: Thoracic vascular structures grossly patent on nondedicated exam.  No thoracic adenopathy.  Visualized upper abdomen normal  appearance.  Lungs clear.  No pulmonary infiltrate, pleural effusion or pneumothorax.  Specifically, no pulmonary masses, infiltrates or micro nodularity identified to suggest pulmonary metastatic disease.  Osseous structures unremarkable.  IMPRESSION: Normal CT chest.   Electronically Signed   By: MLavonia DanaM.D.   On: 05/01/2015 16:41    ASSESSMENT: Patient with carcinoma thyroid multifocal largest lesion being 2.5 cm.  Positive lymph node Status post radioactive iodine therapy Thyroglobulin is 0.99 There is no clinical evidence of recurrent disease   2d ago (05/01/15) 254mogo (03/04/15)  2 days ago  (02/11/15) 72m35monthgo   months ago    TSH 0.350 - 4.500 uIU/mL 10.701 (H) 69.728 (H) 77.788 (H)           MEDICAL DECISION MAKING:  CT scan has been reviewed independently and reviewed the patient and family. There is no evidence of any metastasis disease TSH is still slightly elevated at 10.6 Increase Synthroid 125 g daily Recheck TSH and T4 in 4-6 weeks If it remains stable reevaluation in 6 month CT scan of the chest has been reviewed independently shows no evidence of metastatic disease this has been discussed with the patient and family and also scan has been reviewed with the family.  I also discussed situation with Dr. McQTami RibasThyroid carcinoma   Staging form: Thyroid - Papillary or Follicular (Under 45 years), AJCC 7th Edition     Clinical: Stage I (T2(m), N1, M0) - Signed by JanForest GleasonD on 01/08/2015   JanForest GleasonD   05/03/2015 11:46 AM

## 2015-05-07 LAB — THYROGLOBULIN LEVEL: Thyroglobulin: 2.9 ng/mL

## 2015-05-16 ENCOUNTER — Ambulatory Visit (INDEPENDENT_AMBULATORY_CARE_PROVIDER_SITE_OTHER): Payer: 59 | Admitting: Obstetrics and Gynecology

## 2015-05-16 ENCOUNTER — Encounter: Payer: Self-pay | Admitting: Obstetrics and Gynecology

## 2015-05-16 ENCOUNTER — Encounter: Payer: Self-pay | Admitting: *Deleted

## 2015-05-16 VITALS — BP 127/84 | HR 80 | Ht 67.0 in | Wt 203.3 lb

## 2015-05-16 DIAGNOSIS — E663 Overweight: Secondary | ICD-10-CM

## 2015-05-16 NOTE — Progress Notes (Signed)
Subjective:     Patient ID: Lindsay Ramos, female   DOB: [birth year], 24 y.o.   MRN: [id number]  HPI S/P thyroidectomy secondary to carcinoma in May 2016 Desires restart of adipex and B12 for continued weight loss  Review of Systems Weight has fluctuated 8# since surgery, episodes of fatigue; levels are no stable yet with last TSH 10 Increased appetite Fatigue (improving with dose changes of synthroid)     Objective:   Physical Exam A&O x4  well groomed female in no distress Exam not indicated    Assessment:     Overweight S/P thyroidectomy-carcinoma      Plan:     Explained need to wait until TSH and medication was stable for at least 3 months before considering restart of adipex. Encourage regular exercise and small portions, increased water intake and adequate sleep in mean time.  She will contact our office at that time if desires to restart medications.  Melody Trudee Kuster, CNM

## 2015-06-27 ENCOUNTER — Inpatient Hospital Stay: Payer: 59 | Attending: Oncology

## 2015-06-27 DIAGNOSIS — C73 Malignant neoplasm of thyroid gland: Secondary | ICD-10-CM | POA: Insufficient documentation

## 2015-06-27 LAB — TSH: TSH: 3.546 u[IU]/mL (ref 0.350–4.500)

## 2015-07-01 ENCOUNTER — Telehealth: Payer: Self-pay | Admitting: *Deleted

## 2015-07-01 NOTE — Telephone Encounter (Signed)
Asking for results from Thursday thyroid labs and if her med needs to be adjusted

## 2015-07-01 NOTE — Telephone Encounter (Signed)
No need to adjust med at this time Per Dr Lindsay Ramos. Patient informed. And stated "perfect thank you so much"

## 2015-07-02 LAB — THYROGLOBULIN LEVEL: Thyroglobulin: <2 ng/mL

## 2015-07-02 LAB — T4: T4, Total: 9.5 ug/dL (ref 4.5–12.0)

## 2015-09-18 ENCOUNTER — Telehealth: Payer: Self-pay | Admitting: Obstetrics and Gynecology

## 2015-09-18 NOTE — Telephone Encounter (Signed)
Patient called stating she was ready to restart the weight loss program. She stated that her thyroid levels are back to normal post thyroid cancer. Thanks

## 2015-09-18 NOTE — Telephone Encounter (Signed)
Have her make appt to come in and start meds

## 2015-09-18 NOTE — Telephone Encounter (Signed)
pls advise

## 2015-09-19 NOTE — Telephone Encounter (Signed)
done

## 2015-09-25 ENCOUNTER — Ambulatory Visit (INDEPENDENT_AMBULATORY_CARE_PROVIDER_SITE_OTHER): Payer: 59 | Admitting: Obstetrics and Gynecology

## 2015-09-25 ENCOUNTER — Encounter: Payer: Self-pay | Admitting: Obstetrics and Gynecology

## 2015-09-25 VITALS — BP 127/82 | HR 95 | Ht 67.0 in | Wt 210.0 lb

## 2015-09-25 DIAGNOSIS — E669 Obesity, unspecified: Secondary | ICD-10-CM

## 2015-09-25 MED ORDER — CYANOCOBALAMIN 1000 MCG/ML IJ SOLN: 1000.0000 ug | Freq: Once | INTRAMUSCULAR | Status: DC

## 2015-09-25 MED ORDER — PHENTERMINE HCL 37.5 MG PO TABS: 37.5000 mg | ORAL_TABLET | Freq: Every day | ORAL | Status: DC

## 2015-09-25 NOTE — Progress Notes (Signed)
Subjective:  Lindsay Ramos is a 25 y.o. G1P0 at Unknown being seen today for weight loss management- initial visit.  Patient reports General ROS: negative and reports previous weight loss attempts: successful until thyroid disorder.   The patient has a surgical history of: thyroidectomy The following portions of the patient's history were reviewed and updated as appropriate: allergies, current medications, past family history, past medical history, past social history, past surgical history and problem list.   Objective:   Filed Vitals:   09/25/15 1350  BP: 127/82  Pulse: 95  Height: 5\' 7"  (1.702 m)  Weight: 210 lb (95.255 kg)    General:  Alert, oriented and cooperative. Patient is in no acute distress.  :   :   :   :   :   :   PE: Well groomed female in no current distress,   Mental Status: Normal mood and affect. Normal behavior. Normal judgment and thought content.   Current BMI: Body mass index is 32.88 kg/(m^2).   Assessment and Plan:  Obesity  There are no diagnoses linked to this encounter.  Plan: low carb, High protein diet RX for adipex 37.5 mg daily and B12 1079mcg.ml monthly, to start now with first injection given at today's visit. Reviewed side-effects common to both medications and expected outcomes. Increase daily water intake to at least 8 bottle a day, every day.  Goal is to reduse weight by 10% by end of three months, and will re-evaluate then.  RTC in 4 weeks for Nurse visit to check weight & BP, and get next B12 injections.    Please refer to After Visit Summary for other counseling recommendations.    Geraldene Eisel N [place], CNM   Leshawn Houseworth [employer], CNM      Consider the Low Glycemic Index Diet and 6 smaller meals daily .  This boosts your metabolism and regulates your sugars:   Use the protein bar by [employer]kins because they have lots of fiber in them  Find the low carb flatbreads, tortillas and pita breads for sandwiches:  Joseph's  makes a pita bread and a flat bread , available at [facility] and [employer]; [place] makes a low carb flatbread available at [employer] and HT that is 9 net carbs and 100 cal [place] makes a low carb whole wheat tortilla available at [employer] most grocery stores with 6 net carbs and 210 cal  [place] yogurt can still have a lot of carbs .  Dannon Light N fit has 80 cal and 8 carbs

## 2015-10-14 ENCOUNTER — Telehealth: Payer: Self-pay | Admitting: Obstetrics and Gynecology

## 2015-10-14 NOTE — Telephone Encounter (Signed)
FYI

## 2015-10-14 NOTE — Telephone Encounter (Signed)
Patient called and just wanted to make you aware that she is unable to take phentermine since getting her thyroid out. She stated it made her feel like a walking zombie.Thanks

## 2015-10-16 ENCOUNTER — Encounter: Payer: Self-pay | Admitting: Obstetrics and Gynecology

## 2015-10-24 ENCOUNTER — Encounter: Payer: Self-pay | Admitting: Obstetrics and Gynecology

## 2015-10-30 ENCOUNTER — Encounter: Payer: Self-pay | Admitting: Oncology

## 2015-10-30 ENCOUNTER — Inpatient Hospital Stay: Payer: PRIVATE HEALTH INSURANCE

## 2015-10-30 ENCOUNTER — Inpatient Hospital Stay: Payer: PRIVATE HEALTH INSURANCE | Attending: Oncology | Admitting: Oncology

## 2015-10-30 VITALS — BP 129/82 | HR 96 | Temp 98.9°F | Resp 18 | Wt 202.4 lb

## 2015-10-30 DIAGNOSIS — C73 Malignant neoplasm of thyroid gland: Secondary | ICD-10-CM

## 2015-10-30 DIAGNOSIS — Z85850 Personal history of malignant neoplasm of thyroid: Secondary | ICD-10-CM | POA: Insufficient documentation

## 2015-10-30 DIAGNOSIS — Z79899 Other long term (current) drug therapy: Secondary | ICD-10-CM | POA: Diagnosis not present

## 2015-10-30 DIAGNOSIS — R5383 Other fatigue: Secondary | ICD-10-CM | POA: Diagnosis not present

## 2015-10-30 DIAGNOSIS — E890 Postprocedural hypothyroidism: Secondary | ICD-10-CM | POA: Diagnosis not present

## 2015-10-30 DIAGNOSIS — R531 Weakness: Secondary | ICD-10-CM | POA: Insufficient documentation

## 2015-10-30 LAB — TSH: TSH: 1.952 u[IU]/mL (ref 0.350–4.500)

## 2015-10-30 NOTE — Progress Notes (Signed)
[place] @ [facility] Telephone:(336) [phone]  Fax:(336) [place]: 1991/03/22  MR#: [id number]  [id number]  Patient Care Team: Gayland Curry, MD as PCP - General (Family Medicine) Beverly Gust, MD (Unknown Physician Specialty)  CHIEF COMPLAINT:  Chief Complaint  Patient presents with  . Thyroid Cancer   patient is here for further follow-up regarding carcinoma of the thyroid as well as discussing the results of the CT scan of the chest Oncology History   1.  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  Patient had multiple nodules.  One tumor was 2.5 cm and 2.5 cm x 1.9 cm extending to the lower right lobe.  The second tumor was 0.6 cm in right lower lobe.  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 2.  Patient had a radioactive iodine therapy in June of 2016        Oncology Flowsheet 01/01/2015 01/01/2015  dexamethasone (DECADRON) IJ -    diphenhydrAMINE (BENADRYL) PO 25 mg    ondansetron (ZOFRAN) IV - 4 mg    INTERVAL HISTORY: 25 year old lady with a history of carcinoma of thyroid status post total thyroidectomy here for further evaluation and treatment consideration.  Patient has stage I disease.  (Patient's below age of 89) metastases to lymph node.  Gradually improving feeling somewhat weak and tired.  She is very apprehensive.  Taking 125  g of Synthroid.  Somewhat tired.  Otherwise remains  Asymptomatic Patient feels somewhat weak and tired.  No chills.  No fever. SHE had  number of questions regarding a chest seeing Synthroid medication as well as   Regarding   Next pregnancy.  REVIEW OF SYSTEMS:   GENERAL:  Feels good.  Active.  No fevers, sweats or weight loss. PERFORMANCE STATUS (ECOG):  0 HEENT:  No visual changes, runny nose, sore throat, mouth sores or tenderness. Lungs: No shortness of breath or  cough.  No hemoptysis. Cardiac:  No chest pain, palpitations, orthopnea, or PND. GI:  No nausea, vomiting, diarrhea, constipation, melena or hematochezia. GU:  No urgency, frequency, dysuria, or hematuria. Musculoskeletal:  No back pain.  No joint pain.  No muscle tenderness. Extremities:  No pain or swelling. Skin:  No rashes or skin changes. Neuro:  No headache, numbness or weakness, balance or coordination issues. Endocrine:  No diabetes, thyroid issues, hot flashes or night sweats. Psych:  No mood changes, depression or anxiety. Pain:  No focal pain. Review of systems:  All other systems reviewed and found to be negative.  As per HPI. Otherwise, a complete review of systems is negatve.  PAST MEDICAL HISTORY: Past Medical History  Diagnosis Date  . Family history of adverse reaction to anesthesia     pts mom has stopped breathing   . Cancer [facility])     Thyroid    PAST SURGICAL HISTORY: Past Surgical History  Procedure Laterality Date  . Appendectomy    . Cesarean section    . Lymph node biopsy    . Thyroidectomy N/A 01/01/2015    Procedure: THYROIDECTOMY, total with laryngeal nerve monitoring ;  Surgeon: Beverly Gust, MD;  Location: [facility] ORS;  Service: ENT;  Laterality: N/A;    FAMILY HISTORY No significant past family history of ovarian cancer breast cancer or colon cancer      ADVANCED DIRECTIVES: No advance care directive patient did not want  to get into discuss at present time   HEALTH MAINTENANCE: Social History  Substance Use Topics  . Smoking status: Never Smoker   . Smokeless tobacco: None  . Alcohol Use: No      Allergies  Allergen Reactions  . Codeine Hives    Current Outpatient Prescriptions  Medication Sig Dispense Refill  . levothyroxine (SYNTHROID) 125 MCG tablet Take 1 tablet (125 mcg total) by mouth daily before breakfast. Start when finishes 155mcg and 25 mcg tablets. 30 tablet 6   No current facility-administered medications for this  visit.    OBJECTIVE:  Filed Vitals:   10/30/15 1556  BP: 129/82  Pulse: 96  Temp: 98.9 F (37.2 C)  Resp: 18     Body mass index is 31.69 kg/(m^2).    ECOG FS:0 - Asymptomatic  PHYSICAL EXAM: Gen. status: Slightly apprehensive and anxious lady not any acute distress HEENT: Thyroid surgery wound is healing well.  No palpable lymphadenopathy Examination of the chest was unremarkable. There were no bony deformities, no asymmetry, and no other abnormalities. Cardiac exam revealed the PMI to be normally situated and sized. The rhythm was regular and no extrasystoles were noted during several minutes of auscultation. The first and second heart sounds were normal and physiologic splitting of the second heart sound was noted. There were no murmurs, rubs, clicks, or gallops Abdominal exam revealed normal bowel sounds. The abdomen was soft, non-tender, and without masses, organomegaly, or appreciable enlargement of the abdominal aortaExamination of the skin revealed no evidence of significant rashes, suspicious appearing nevi or other concerning lesions Neurologically, the patient was awake, alert, and oriented to person, place and time. There were no obvious focal neurologic abnormalities.   LAB RESULTS:  No visits with results within 3 Day(s) from this visit. Latest known visit with results is:  Appointment on 06/27/2015  Component Date Value Ref Range Status  . T4, Total 06/27/2015 9.5  4.5 - 12.0 ug/dL Final   Comment: (NOTE) Performed At: [facility] [place], [place] [id number] Lindon Romp MD [id number]   . TSH 06/27/2015 3.546  0.350 - 4.500 uIU/mL Final  . Thyroglobulin 06/27/2015 <2.0   Final   Comment: (NOTE) Reference Range: Pubertal Children and Adults: <40 According to the [facility] of Clinical Biochemistry, the reference interval for Thyroglobulin (TG) should be related to euthyroid patients and not for patients who underwent  thyroidectomy.  TG reference intervals for these patients depend on the residual mass of the thyroid tissue left after surgery.  Establishing a post-operative baseline is recommended.  The assay quantitation limit is 2.0 ng/mL. Performed At: [facility] [place], [place] [id number] Pepkowitz Sheral Apley MD [id number]     No results found for: LABCA2 No results found for: CA199 No results found for: CEA No results found for: PSA No results found for: CA125   STUDIES: No results found.  ASSESSMENT: Patient with carcinoma thyroid multifocal largest lesion being 2.5 cm.  Positive lymph node Status post radioactive iodine therapy Thyroglobulin is 0.99 There is no clinical evidence of recurrent disease. Awaiting T4 TSH results. Due to my planned retirement I given options regarding follow-up thyroid cancer 2 minus with my associate to her endocrinologist before endocrinologists so appointment has been made with Dr. Eddie Dibbles for follow-up regarding thyroid cancer We will adjust the dose of Synthroid medication depending on T4 and TSH result      MEDICAL DECISION MAKING:    Thyroid carcinoma  Staging form: Thyroid - Papillary or Follicular (Under 45 years), AJCC 7th Edition     Clinical: Stage I (T2(m), N1, M0) - Signed by Forest Gleason, MD on 01/08/2015   Forest Gleason, MD   10/30/2015 4:10 PM

## 2015-11-02 ENCOUNTER — Encounter: Payer: Self-pay | Admitting: Oncology

## 2015-11-04 LAB — THYROGLOBULIN LEVEL

## 2015-11-04 LAB — T4: T4, Total: 10.5 ug/dL (ref 4.5–12.0)

## 2015-11-20 ENCOUNTER — Encounter: Payer: Self-pay | Admitting: Obstetrics and Gynecology

## 2015-11-20 ENCOUNTER — Ambulatory Visit (INDEPENDENT_AMBULATORY_CARE_PROVIDER_SITE_OTHER): Payer: 59 | Admitting: Obstetrics and Gynecology

## 2015-11-20 VITALS — BP 124/81 | HR 79 | Ht 67.0 in | Wt 200.7 lb

## 2015-11-20 DIAGNOSIS — Z30014 Encounter for initial prescription of intrauterine contraceptive device: Secondary | ICD-10-CM | POA: Diagnosis not present

## 2015-11-20 NOTE — Patient Instructions (Signed)
1. Return tomorrow for ParaGard IUD insertion along with annual exam

## 2015-11-20 NOTE — Progress Notes (Signed)
Chief complaint: 1. Contraception  Lindsay Ramos presents today for discussion regarding IUDs. She is status post thyroidectomy and radioactive iodine 131 management of thyroid cancer this past year. She is to avoid conception for at least another 2 years. Patient does not desire birth control pills because of difficulty taking daily pills as well as the side effects associated with them. She does not desire Mirena IUD because of hormonal component issues. Patient has researched the ParaGard IUD and desires this alternative.  Patient does not have history of menorrhagia or dysmenorrhea. She does understand that ParaGard IUD may contribute to increased heaviness of cycle as well as possible increased cramps. She does understand and accepts these potential side effects.  Patient has not had intercourse for over 3 weeks. She just completed her menses 1 week ago. Patient will return tomorrow as scheduled for her annual exam and we will insert the ParaGard IUD at that time.  ASSESSMENT: 1. Contraceptive management-decision for ParaGard IUD  PLAN: 1. Return as scheduled for IUD insertion-tomorrow  A total of 15 minutes were spent face-to-face with the patient during this encounter and over half of that time dealt with counseling and coordination of care.  Brayton Mars, MD  Note: This dictation was prepared with Dragon dictation along with smaller phrase technology. Any transcriptional errors that result from this process are unintentional.

## 2015-11-21 ENCOUNTER — Ambulatory Visit (INDEPENDENT_AMBULATORY_CARE_PROVIDER_SITE_OTHER): Payer: 59 | Admitting: Obstetrics and Gynecology

## 2015-11-21 ENCOUNTER — Encounter: Payer: Self-pay | Admitting: Obstetrics and Gynecology

## 2015-11-21 VITALS — BP 106/70 | HR 90 | Ht 67.0 in | Wt 199.9 lb

## 2015-11-21 DIAGNOSIS — Z01419 Encounter for gynecological examination (general) (routine) without abnormal findings: Secondary | ICD-10-CM

## 2015-11-21 DIAGNOSIS — Z975 Presence of (intrauterine) contraceptive device: Secondary | ICD-10-CM | POA: Diagnosis not present

## 2015-11-21 DIAGNOSIS — Z30014 Encounter for initial prescription of intrauterine contraceptive device: Secondary | ICD-10-CM

## 2015-11-21 LAB — POCT URINE PREGNANCY: Preg Test, Ur: NEGATIVE

## 2015-11-21 NOTE — Progress Notes (Signed)
Patient ID: Lindsay Ramos, female   DOB: [birth year], 25 y.o.   MRN: [id number] ANNUAL PREVENTATIVE CARE GYN  ENCOUNTER NOTE  Subjective:       Lindsay Ramos is a 25 y.o. G1P0 female here for a routine annual gynecologic exam.  Current complaints: 1.  paragard insertion       Significant history for thyroid cancer; status post thyroidectomy and I-131 therapy.  Patient is not to get pregnant for at least the next 2 years.  Gynecologic History Patient's last menstrual period was 11/10/2015 (exact date). Contraception: condoms Last Pap: neg. Results were: normal Last mammogram: n/a. Results were: n/a  Obstetric History OB History  Gravida Para Term Preterm AB SAB TAB Ectopic Multiple Living  1         1    # Outcome Date GA Lbr Len/2nd Weight Sex Delivery Anes PTL Lv  1 Gravida 03/08/13    F CS-Unspec   Y      Past Medical History  Diagnosis Date  . Family history of adverse reaction to anesthesia     pts mom has stopped breathing   . Cancer ([place])     Thyroid  . Thyroid cancer [facility])     Past Surgical History  Procedure Laterality Date  . Appendectomy    . Cesarean section    . Lymph node biopsy    . Thyroidectomy N/A 01/01/2015    Procedure: THYROIDECTOMY, total with laryngeal nerve monitoring ;  Surgeon: Beverly Gust, MD;  Location: [facility] ORS;  Service: ENT;  Laterality: N/A;    Current Outpatient Prescriptions on File Prior to Visit  Medication Sig Dispense Refill  . levothyroxine (SYNTHROID) 125 MCG tablet Take 1 tablet (125 mcg total) by mouth daily before breakfast. Start when finishes 165mcg and 25 mcg tablets. 30 tablet 6   No current facility-administered medications on file prior to visit.    Allergies  Allergen Reactions  . Codeine Hives    Social History   Social History  . Marital Status: Married    Spouse Name: N/A  . Number of Children: N/A  . Years of Education: N/A   Occupational History  . Not on file.   Social History Main  Topics  . Smoking status: Never Smoker   . Smokeless tobacco: Not on file  . Alcohol Use: No  . Drug Use: No  . Sexual Activity: Yes    Birth Control/ Protection: Condom   Other Topics Concern  . Not on file   Social History Narrative    Family History  Problem Relation Age of Onset  . Thyroid disease Mother   . Thyroid disease Maternal Grandmother   . Cancer Paternal Grandfather     lung    The following portions of the patient's history were reviewed and updated as appropriate: allergies, current medications, past family history, past medical history, past social history, past surgical history and problem list.  Review of Systems ROS Review of Systems - General ROS: negative for - chills, fatigue, fever, hot flashes, night sweats, weight gain or weight loss Psychological ROS: negative for - anxiety, decreased libido, depression, mood swings, physical abuse or sexual abuse Ophthalmic ROS: negative for - blurry vision, eye pain or loss of vision ENT ROS: negative for - headaches, hearing change, visual changes or vocal changes Allergy and Immunology ROS: negative for - hives, itchy/watery eyes or seasonal allergies Hematological and Lymphatic ROS: negative for - bleeding problems, bruising, swollen lymph nodes or weight loss Endocrine  ROS: negative for - galactorrhea, hair pattern changes, hot flashes, malaise/lethargy, mood swings, palpitations, polydipsia/polyuria, skin changes, temperature intolerance or unexpected weight changes Breast ROS: negative for - new or changing breast lumps or nipple discharge Respiratory ROS: negative for - cough or shortness of breath Cardiovascular ROS: negative for - chest pain, irregular heartbeat, palpitations or shortness of breath Gastrointestinal ROS: no abdominal pain, change in bowel habits, or black or bloody stools Genito-Urinary ROS: no dysuria, trouble voiding, or hematuria Musculoskeletal ROS: negative for - joint pain or joint  stiffness Neurological ROS: negative for - bowel and bladder control changes Dermatological ROS: negative for rash and skin lesion changes   Objective:   BP 106/70 mmHg  Pulse 90  Ht 5\' 7"  (1.702 m)  Wt 199 lb 14.4 oz (90.674 kg)  BMI 31.30 kg/m2  LMP 11/10/2015 (Exact Date) CONSTITUTIONAL: Well-developed, well-nourished female in no acute distress.  PSYCHIATRIC: Normal mood and affect. Normal behavior. Normal judgment and thought content. [place]: Alert and oriented to person, place, and time. Normal muscle tone coordination. No cranial nerve deficit noted. HENT:  Normocephalic, atraumatic, External right and left ear normal. Oropharynx is clear and moist EYES: Conjunctivae and EOM are normal. Pupils are equal, round, and reactive to light. No scleral icterus.  NECK: Normal range of motion, supple, no masses.  Normal thyroid.  SKIN: Skin is warm and dry. No rash noted. Not diaphoretic. No erythema. No pallor. CARDIOVASCULAR: Normal heart rate noted, regular rhythm, no murmur. RESPIRATORY: Clear to auscultation bilaterally. Effort and breath sounds normal, no problems with respiration noted. BREASTS: Symmetric in size. No masses, skin changes, nipple drainage, or lymphadenopathy. ABDOMEN: Soft, normal bowel sounds, no distention noted.  No tenderness, rebound or guarding.  BLADDER: Normal PELVIC:  External Genitalia: Normal  BUS: Normal  Vagina: Normal  Cervix: Normal; No lesions  Uterus: Normal; midplane, normal size and shape, mobile, nontender  Adnexa: Normal  RV: External Exam NormaI  MUSCULOSKELETAL: Normal range of motion. No tenderness.  No cyanosis, clubbing, or edema.  2+ distal pulses. LYMPHATIC: No Axillary, Supraclavicular, or Inguinal Adenopathy.   PROCEDURE: IUD insertion IUD Insertion Procedure Note  Pre-operative Diagnosis:  contraception  Post-operative Diagnosis: same  Indications: contraception  Procedure Details  Urine pregnancy test -negative.  The  risks (including infection, bleeding, pain, and uterine perforation) and benefits of the procedure were explained to the patient and Verbal informed consent was obtained.    Cervix cleansed with Betadine. Uterus sounded to 8 cm. IUD inserted without difficulty. String visible and trimmed. Patient tolerated procedure well.  IUD Information: ParaGard.  Lot [id number]; removal no later than March 2027;  String length 3 cm  Condition: Stable  Complications: None  Plan:  The patient was advised to call for any fever or for prolonged or severe pain or bleeding. She was advised to use Naproxen as needed for mild to moderate pain.   Attending Physician Documentation: Alanda Slim Norbert Malkin   Assessment:   Annual gynecologic examination 25 y.o. Contraception: condoms bmi-31  IUD inserted-ParaGard   Plan:  Pap: Pap, Reflex if ASCUS Mammogram: Not Indicated Stool Guaiac Testing:  Not Indicated Labs: thru pcp Routine preventative health maintenance measures emphasized: Exercise/Diet/Weight control, Tobacco Warnings and Alcohol/Substance use risks  ParaGard IUD insertion; return in 4 weeks for string check Return to [place], CMA  Brayton Mars, MD  Note: This dictation was prepared with Dragon dictation along with smaller phrase technology. Any transcriptional errors that result from  this process are unintentional.

## 2015-11-25 ENCOUNTER — Telehealth: Payer: Self-pay | Admitting: Obstetrics and Gynecology

## 2015-11-25 NOTE — Telephone Encounter (Signed)
PT CALLED AND SHE WANTED TO KNOW IF YOU COULD DO REFERRALS FOR A THERAPIST, SHE IS HAVING ISSUE WITH HER MARRIAGE AND PARENTS AND IS THINKING THAT MAYBE SHE NEEDS TO TALK TO SOMEONE. PLEASE PUT REFERRAL IN [facility]

## 2015-11-26 ENCOUNTER — Encounter: Payer: Self-pay | Admitting: Obstetrics and Gynecology

## 2015-11-26 ENCOUNTER — Ambulatory Visit (INDEPENDENT_AMBULATORY_CARE_PROVIDER_SITE_OTHER): Payer: 59 | Admitting: Obstetrics and Gynecology

## 2015-11-26 VITALS — BP 120/82 | HR 75 | Ht 67.0 in | Wt 200.0 lb

## 2015-11-26 DIAGNOSIS — Z638 Other specified problems related to primary support group: Secondary | ICD-10-CM

## 2015-11-26 DIAGNOSIS — F419 Anxiety disorder, unspecified: Secondary | ICD-10-CM | POA: Diagnosis not present

## 2015-11-26 MED ORDER — SERTRALINE HCL 50 MG PO TABS: 50.0000 mg | ORAL_TABLET | Freq: Every day | ORAL | Status: DC

## 2015-11-26 NOTE — Patient Instructions (Signed)
1. Referral to Ellin Saba, psychology 2. Begin Zoloft 50 mg a day (take one half tablet a day for the first week) 3. Return in 6 weeks for follow-up

## 2015-11-26 NOTE — Progress Notes (Signed)
Chief complaint: 1. Anxiety 2. Family stressors  Lindsay Ramos presents today for above issues. She is not suicidal. PH Q-9 Anxiety/depression questionnaire was completed. Issues regarding marriage, family, child raising, work, counseling intervention were addressed. Management plan including psychology counseling and anxiolytic therapy were recommended. SSRI medications and and benzodiazepines-pros and cons were reviewed.  ASSESSMENT: 1. Anxiety 2. Family stressors  PLAN: 1. Referral to Ellin Saba, psychology 2. Begin Zoloft therapy 50 mg a day (started 25 mg daily for the first week) 3. Return in 6 weeks.  A total of 25 minutes were spent face-to-face with the patient during this encounter and over half of that time involved counseling and coordination of care.  Brayton Mars, MD  Note: This dictation was prepared with Dragon dictation along with smaller phrase technology. Any transcriptional errors that result from this process are unintentional.

## 2015-11-26 NOTE — Telephone Encounter (Signed)
Pt seen in office today.- Referral to [employer] done-

## 2015-11-27 LAB — PAP IG W/ RFLX HPV ASCU: PAP Smear Comment: 0

## 2015-11-28 ENCOUNTER — Telehealth: Payer: Self-pay

## 2015-11-28 NOTE — Telephone Encounter (Signed)
-----   Message from Brayton Mars, MD sent at 11/27/2015  4:47 PM EDT ----- Please Notify - Labs normal

## 2015-12-16 ENCOUNTER — Other Ambulatory Visit: Payer: Self-pay | Admitting: Oncology

## 2015-12-19 ENCOUNTER — Encounter: Payer: Self-pay | Admitting: Obstetrics and Gynecology

## 2015-12-19 ENCOUNTER — Ambulatory Visit (INDEPENDENT_AMBULATORY_CARE_PROVIDER_SITE_OTHER): Payer: 59 | Admitting: Obstetrics and Gynecology

## 2015-12-19 VITALS — BP 127/80 | HR 92 | Ht 67.0 in | Wt 197.5 lb

## 2015-12-19 DIAGNOSIS — Z30431 Encounter for routine checking of intrauterine contraceptive device: Secondary | ICD-10-CM

## 2015-12-19 NOTE — Progress Notes (Signed)
GYN ENCOUNTER NOTE  Subjective:       Lindsay Ramos is a 25 y.o. G1P0 female is here for gynecologic evaluation of the following issues:  1. IUD placement check.     25yo female, G1P0101, presents today for follow up placement for paragaurd IUD placed 11/21/2015.  Patient describes moderate to severe pain for 2 weeks following insertion, adequately controled by ibuprofen.  She also describes spotting up until menses began on 12/05/2015.  Menses lasted 4 days, was described as very heavy, "bled through my pants for the first time since high school."  Denies fever and pain or bleeding since menses.    Gynecologic History Patient's last menstrual period was 12/05/2015. Contraception: IUD Last Pap: neg. Results were: normal Last mammogram: N/A. Results were: N/A  Obstetric History OB History  Gravida Para Term Preterm AB SAB TAB Ectopic Multiple Living  1         1    # Outcome Date GA Lbr Len/2nd Weight Sex Delivery Anes PTL Lv  1 Gravida 03/08/13    F CS-Unspec   Y      Past Medical History  Diagnosis Date  . Family history of adverse reaction to anesthesia     pts mom has stopped breathing   . Cancer ([place])     Thyroid  . Thyroid cancer [facility])     Past Surgical History  Procedure Laterality Date  . Appendectomy    . Cesarean section    . Lymph node biopsy    . Thyroidectomy N/A 01/01/2015    Procedure: THYROIDECTOMY, total with laryngeal nerve monitoring ;  Surgeon: Beverly Gust, MD;  Location: [facility] ORS;  Service: ENT;  Laterality: N/A;    Current Outpatient Prescriptions on File Prior to Visit  Medication Sig Dispense Refill  . levothyroxine (SYNTHROID, LEVOTHROID) 125 MCG tablet TAKE 1 TABLET(125 MCG) BY MOUTH DAILY BEFORE BREAKFAST. START WHEN FINISHES 100 MCG AND 25 MCG TABLETS 30 tablet 0  . sertraline (ZOLOFT) 50 MG tablet Take 1 tablet (50 mg total) by mouth daily. 30 tablet 6   No current facility-administered medications on file prior to visit.     Allergies  Allergen Reactions  . Codeine Hives    Social History   Social History  . Marital Status: Married    Spouse Name: N/A  . Number of Children: N/A  . Years of Education: N/A   Occupational History  . Not on file.   Social History Main Topics  . Smoking status: Never Smoker   . Smokeless tobacco: Not on file  . Alcohol Use: No  . Drug Use: No  . Sexual Activity: Yes    Birth Control/ Protection: Condom, IUD   Other Topics Concern  . Not on file   Social History Narrative    Family History  Problem Relation Age of Onset  . Thyroid disease Mother   . Thyroid disease Maternal Grandmother   . Cancer Paternal Grandfather     lung    The following portions of the patient's history were reviewed and updated as appropriate: allergies, current medications, past family history, past medical history, past social history, past surgical history and problem list.  Review of Systems Review of Systems - Negative except those noted in HPI. Review of Systems - General ROS: negative for - chills, fatigue, fever, hot flashes, malaise or night sweats Hematological and Lymphatic ROS: negative for - bleeding problems or swollen lymph nodes Gastrointestinal ROS: negative for - abdominal pain, blood in  stools, change in bowel habits and nausea/vomiting Musculoskeletal ROS: negative for - joint pain, muscle pain or muscular weakness Genito-Urinary ROS: negative for - change in menstrual cycle, dysmenorrhea, dyspareunia, dysuria, genital discharge, genital ulcers, hematuria, incontinence, irregular/heavy menses, nocturia or pelvic painjj  Objective:   BP 127/80 mmHg  Pulse 92  Ht 5\' 7"  (1.702 m)  Wt 197 lb 8 oz (89.585 kg)  BMI 30.93 kg/m2  LMP 12/05/2015 CONSTITUTIONAL: Well-developed, well-nourished female in no acute distress.  HENT:  Normocephalic, atraumatic.  NECK: Normal range of motion, supple, no masses.  Normal thyroid.  SKIN: Skin is warm and dry. No rash  noted. Not diaphoretic. No erythema. No pallor. [place]: Alert and oriented to person, place, and time. PSYCHIATRIC: Normal mood and affect. Normal behavior. Normal judgment and thought content. CARDIOVASCULAR:Not Examined RESPIRATORY: Not Examined BREASTS: Not Examined ABDOMEN: Soft, non distended; Non tender.  No Organomegaly. PELVIC:  External Genitalia: Normal, no rashes, lesions, hyperemia  BUS: Normal  Vagina: Normal, no lesions, hyperemia, nontender  Cervix: Normal, no cervical motion tenderness, paragard  3.5cm strings noted  Uterus: Normal size, shape,consistency, mobile, non-tender  Adnexa: Normal, nontender  RV: Normal   Bladder: Nontender MUSCULOSKELETAL: Normal range of motion. No tenderness.  No cyanosis, clubbing, or edema.     Assessment:   Paragaurd IUD intact and adequately placed   Plan:   Patient instructed to call for any severe pain or bleeding.  Follow up in 3 weeks as indicated.  Jen Mow, PA-S Brayton Mars, MD  I have reviewed the record and concur with patient management and plan. Zethan Alfieri, Hassell Done, MD, FACOG  Note: This dictation was prepared with Dragon dictation along with smaller phrase technology. Any transcriptional errors that result from this process are unintentional.

## 2015-12-26 ENCOUNTER — Telehealth: Payer: Self-pay | Admitting: *Deleted

## 2015-12-26 MED ORDER — VALACYCLOVIR HCL 500 MG PO TABS: 500.0000 mg | ORAL_TABLET | Freq: Two times a day (BID) | ORAL | Status: AC

## 2015-12-26 MED ORDER — IBUPROFEN 800 MG PO TABS: 800.0000 mg | ORAL_TABLET | Freq: Three times a day (TID) | ORAL | Status: DC | PRN

## 2015-12-26 NOTE — Telephone Encounter (Signed)
Patient called and stated that she needs a refill of her 800 mg Ibuprofen and also Valtrex. Her pharmacy is [facility] in [place]. Thanks

## 2015-12-26 NOTE — Telephone Encounter (Signed)
PT AWARE MEDS ERX.

## 2016-01-07 ENCOUNTER — Ambulatory Visit: Payer: 59 | Admitting: Obstetrics and Gynecology

## 2016-01-15 ENCOUNTER — Encounter: Payer: Self-pay | Admitting: Obstetrics and Gynecology

## 2016-01-15 ENCOUNTER — Ambulatory Visit (INDEPENDENT_AMBULATORY_CARE_PROVIDER_SITE_OTHER): Payer: 59 | Admitting: Obstetrics and Gynecology

## 2016-01-15 VITALS — BP 123/74 | HR 83 | Ht 67.0 in | Wt 197.8 lb

## 2016-01-15 DIAGNOSIS — F419 Anxiety disorder, unspecified: Secondary | ICD-10-CM | POA: Diagnosis not present

## 2016-01-15 DIAGNOSIS — Z638 Other specified problems related to primary support group: Secondary | ICD-10-CM | POA: Diagnosis not present

## 2016-01-15 NOTE — Progress Notes (Signed)
Chief complaint: 1. Anxiety  Patient presents for 6 week follow-up on anxiety after being started on Zoloft 50 mg a day. Patient is doing much better with control of symptomatology. She is not experiencing any significant side effects in the form of headaches, nausea, or abdominal cramping. Patient is seeing Ellin Saba in psychology for counseling on an every weeks to every other week basis. She feels that she is getting excellent control of her anxiety issues.  Past medical history, past surgical history, problem list, medications, and allergies are reviewed  OBJECTIVE: BP 123/74 mmHg  Pulse 83  Ht 5\' 7"  (1.702 m)  Wt 197 lb 12.8 oz (89.721 kg)  BMI 30.97 kg/m2  LMP 12/05/2015 Well-appearing white female in no acute distress. Affect-appropriate Physical exam-deferred  ASSESSMENT: 1. Anxiety disorder, with marked improvement following initiation of psychology counseling and Zoloft therapy  PLAN: 1. Continue with weekly or every other week counseling with Ellin Saba in psychology 2. Continue with Zoloft 50 mg a day 3. Return in 6 months for follow-up  A total of 15 minutes were spent face-to-face with the patient during this encounter and over half of that time dealt with counseling and coordination of care.  Brayton Mars, MD  Note: This dictation was prepared with Dragon dictation along with smaller phrase technology. Any transcriptional errors that result from this process are unintentional.

## 2016-01-15 NOTE — Patient Instructions (Signed)
1. Continue with Zoloft 50 mg a day 2. Continue with counseling with Ellin Saba 3. Return in 6 months for follow-up

## 2016-01-23 ENCOUNTER — Other Ambulatory Visit: Payer: Self-pay | Admitting: *Deleted

## 2016-01-23 NOTE — Telephone Encounter (Signed)
Patient was released form practice and I left msg on her VM to contact Endocrinologist for her refill

## 2016-02-06 ENCOUNTER — Telehealth: Payer: Self-pay | Admitting: Obstetrics and Gynecology

## 2016-02-06 NOTE — Telephone Encounter (Signed)
Patient called stating her thyroid cancer is back and she is having a lot of anxiety. She wanted to know if the dosage of her zoloft can be increased or if she can get a refill on ativan. Thanks

## 2016-02-11 ENCOUNTER — Other Ambulatory Visit: Payer: Self-pay | Admitting: Internal Medicine

## 2016-02-11 DIAGNOSIS — C73 Malignant neoplasm of thyroid gland: Secondary | ICD-10-CM

## 2016-02-11 MED ORDER — SERTRALINE HCL 50 MG PO TABS: 75.0000 mg | ORAL_TABLET | Freq: Every day | ORAL | Status: DC

## 2016-02-11 NOTE — Telephone Encounter (Signed)
Pt aware per mad zoloft increased to 75mg  qd. rx erx.

## 2016-02-17 ENCOUNTER — Encounter
Admission: RE | Admit: 2016-02-17 | Discharge: 2016-02-17 | Disposition: A | Discharge status: Home / Self Care | Payer: Managed Care, Other (non HMO) | Source: Ambulatory Visit | Attending: Internal Medicine | Admitting: Internal Medicine

## 2016-02-17 DIAGNOSIS — C73 Malignant neoplasm of thyroid gland: Secondary | ICD-10-CM | POA: Diagnosis not present

## 2016-02-17 MED ORDER — THYROTROPIN ALFA 1.1 MG IM SOLR
0.9000 mg | INTRAMUSCULAR | Status: AC
  Administered 2016-02-17: 0.9 mg via INTRAMUSCULAR
  Filled 2016-02-17: qty 0.9

## 2016-02-18 ENCOUNTER — Encounter
Admission: RE | Admit: 2016-02-18 | Discharge: 2016-02-18 | Disposition: A | Discharge status: Home / Self Care | Payer: Managed Care, Other (non HMO) | Source: Ambulatory Visit | Attending: Internal Medicine | Admitting: Internal Medicine

## 2016-02-18 DIAGNOSIS — C73 Malignant neoplasm of thyroid gland: Secondary | ICD-10-CM | POA: Diagnosis not present

## 2016-02-18 MED ORDER — THYROTROPIN ALFA 1.1 MG IM SOLR
0.9000 mg | INTRAMUSCULAR | Status: AC
  Administered 2016-02-18: 0.9 mg via INTRAMUSCULAR
  Filled 2016-02-18: qty 0.9

## 2016-02-19 ENCOUNTER — Encounter
Admission: RE | Admit: 2016-02-19 | Discharge: 2016-02-19 | Disposition: A | Discharge status: Home / Self Care | Payer: Managed Care, Other (non HMO) | Source: Ambulatory Visit | Attending: Internal Medicine | Admitting: Internal Medicine

## 2016-02-19 MED ORDER — SODIUM IODIDE I 131 CAPSULE
4.0830 | Freq: Once | INTRAVENOUS | Status: AC | PRN
  Administered 2016-02-19: 4.083 via ORAL

## 2016-02-21 ENCOUNTER — Encounter
Admission: RE | Admit: 2016-02-21 | Discharge: 2016-02-21 | Disposition: A | Discharge status: Home / Self Care | Payer: Managed Care, Other (non HMO) | Source: Ambulatory Visit | Attending: Internal Medicine | Admitting: Internal Medicine

## 2016-02-21 DIAGNOSIS — C73 Malignant neoplasm of thyroid gland: Secondary | ICD-10-CM | POA: Diagnosis not present

## 2016-02-21 MED ORDER — THYROTROPIN ALFA 1.1 MG IM SOLR: 0.9000 mg | INTRAMUSCULAR | Status: AC

## 2016-03-13 ENCOUNTER — Other Ambulatory Visit: Payer: Self-pay | Admitting: Internal Medicine

## 2016-03-13 DIAGNOSIS — C73 Malignant neoplasm of thyroid gland: Secondary | ICD-10-CM

## 2016-03-17 ENCOUNTER — Ambulatory Visit: Admission: RE | Admit: 2016-03-17 | Payer: BLUE CROSS/BLUE SHIELD | Source: Ambulatory Visit

## 2016-03-18 ENCOUNTER — Encounter
Admission: RE | Admit: 2016-03-18 | Discharge: 2016-03-18 | Disposition: A | Discharge status: Home / Self Care | Payer: BLUE CROSS/BLUE SHIELD | Source: Ambulatory Visit | Attending: Internal Medicine | Admitting: Internal Medicine

## 2016-03-18 DIAGNOSIS — C73 Malignant neoplasm of thyroid gland: Secondary | ICD-10-CM | POA: Diagnosis not present

## 2016-03-18 LAB — GLUCOSE, CAPILLARY: Glucose-Capillary: 72 mg/dL (ref 65–99)

## 2016-03-18 MED ORDER — FLUDEOXYGLUCOSE F - 18 (FDG) INJECTION
12.0000 | Freq: Once | INTRAVENOUS | Status: AC | PRN
  Administered 2016-03-18: 12.22 via INTRAVENOUS

## 2016-07-09 ENCOUNTER — Ambulatory Visit: Payer: 59 | Admitting: Obstetrics and Gynecology

## 2016-07-21 ENCOUNTER — Ambulatory Visit: Payer: 59 | Admitting: Obstetrics and Gynecology

## 2016-08-20 IMAGING — NM NM [ID] THYROID CANCER METS SP CA TX
1 series · 2 of 2 positions shown · non-contrast
Comparison: None.

CLINICAL DATA: 23-year-old with papillary thyroid carcinoma. T2 N1
disease. 1 of 1 lymph nodes positive. No extra thyroid extension. No
lymphovascular invasion. Negative margins. Patient status post
adjuvant therapy and remnant ablation 02/12/2015.

EXAM:
NUCLEAR MEDICINE O-JZJ POST THERAPY WHOLE BODY SCAN
TECHNIQUE: The patient received 109 mCi O-JZJ sodium iodide for the treatment
of thyroid cancer within the past 10 days. The patient returns
today, and whole body scanning was performed in the anterior and
posterior projections.

[Series 1000: wholebody (id) post therapy · 2.40mm/px · 2 of 2 frames shown]
[frame 1/2  full-range]
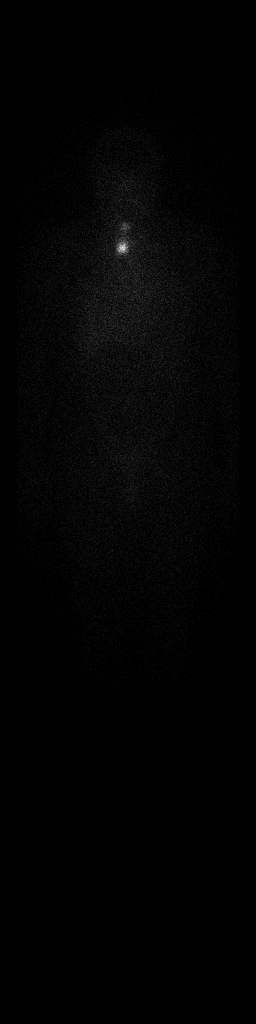
[frame 2/2  full-range]
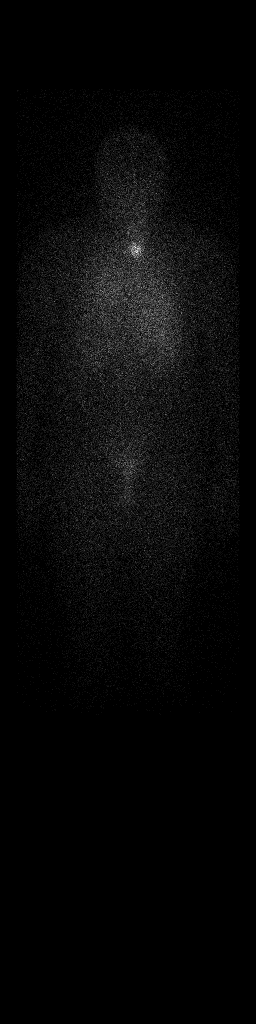

[2 of 2 positions shown; findings below may reference images not displayed]

FINDINGS: There are 2 foci of uptake within the thyroid bed. One midline in
the expected location of the thyroid gland. One larger focus
slightly inferior to the thyroid bed at the level of the thoracic
inlet.

There is faint physiologic uptake within the liver which is typical.
There is faint diffuse uptake within the lungs which is atypical.
IMPRESSION: 1. Two foci of uptake within the thyroid bed are likely a
combination of residual thyroid tissue and lymph node metastasis
versus ectopic thyroid tissue.
2. Faint diffuse intrathoracic uptake is nonspecific. Differential
includes false-positive uptake (potential inflammation) versus
unlikely micro metastasis to the lungs. Recommend following
thyroglobulin levels and consider repeat I 131 scan. A CT scan of
the thorax could be considered for baseline evaluation.
3. No additional evidence of distant metastatic disease.

Findings conveyed Pillo Magner on 02/21/2015  at[DATE].

## 2016-09-28 ENCOUNTER — Telehealth: Payer: Self-pay | Admitting: Obstetrics and Gynecology

## 2016-09-28 MED ORDER — FLUCONAZOLE 150 MG PO TABS: 150.0000 mg | ORAL_TABLET | Freq: Every day | ORAL | 0 refills | Status: DC

## 2016-09-28 NOTE — Telephone Encounter (Signed)
YEAST INF WHITE CREAMY DISCHAGE, ITCHING// CAN SHE HAVE DIFLUCAN SENT TO [facility] IN [facility] [place]

## 2016-09-28 NOTE — Telephone Encounter (Signed)
Pt states she has a yeast infection.  White D/c and itching. Will erx diflucan.

## 2016-09-29 ENCOUNTER — Telehealth: Payer: Self-pay | Admitting: Obstetrics and Gynecology

## 2016-09-29 MED ORDER — FLUCONAZOLE 150 MG PO TABS: 150.0000 mg | ORAL_TABLET | Freq: Every day | ORAL | 0 refills | Status: DC

## 2016-09-29 NOTE — Telephone Encounter (Signed)
Pt aware med erx to [facility] in [place].

## 2016-09-29 NOTE — Telephone Encounter (Signed)
PT CALLED AND SHE CALLED THE PHARMACY THE [facility] WHERE IT WAS CALLED IN AND THEY STILL DID NOT HAVE IT, SO SHE SAID TO SEND IT OT THE  [facility] IN [place]  [place].

## 2016-10-05 NOTE — Progress Notes (Deleted)
Patient ID: Lindsay Ramos, female   DOB: May 15, 1991, 26 y.o.   MRN: [id number] ANNUAL PREVENTATIVE CARE GYN  ENCOUNTER NOTE  Subjective:       Lindsay Ramos is a 26 y.o. G1P0 female here for a routine annual gynecologic exam.  Current complaints: 1      Significant history for thyroid cancer; status post thyroidectomy and I-131 therapy.  Patient is not to get pregnant for at least the next 2 years.  Gynecologic History No LMP recorded. Contraception iud- paragard inserted 11/21/15 Last Pap: 11/21/2015 neg. Results were: normal Last mammogram: n/a. Results were: n/a  Obstetric History OB History  Gravida Para Term Preterm AB Living  1         1  SAB TAB Ectopic Multiple Live Births          1    # Outcome Date GA Lbr Len/2nd Weight Sex Delivery Anes PTL Lv  1 Gravida 03/08/13    F CS-Unspec   LIV      Past Medical History:  Diagnosis Date  . Cancer ([place])    Thyroid  . Family history of adverse reaction to anesthesia    pts mom has stopped breathing   . Thyroid cancer [facility])     Past Surgical History:  Procedure Laterality Date  . APPENDECTOMY    . CESAREAN SECTION    . LYMPH NODE BIOPSY    . THYROIDECTOMY N/A 01/01/2015   Procedure: THYROIDECTOMY, total with laryngeal nerve monitoring ;  Surgeon: Beverly Gust, MD;  Location: [facility] ORS;  Service: ENT;  Laterality: N/A;    Current Outpatient Prescriptions on File Prior to Visit  Medication Sig Dispense Refill  . fluconazole (DIFLUCAN) 150 MG tablet Take 1 tablet (150 mg total) by mouth daily. 1 tablet 0  . ibuprofen (ADVIL,MOTRIN) 800 MG tablet Take 1 tablet (800 mg total) by mouth every 8 (eight) hours as needed. 60 tablet 1  . levothyroxine (SYNTHROID, LEVOTHROID) 125 MCG tablet TAKE 1 TABLET(125 MCG) BY MOUTH DAILY BEFORE BREAKFAST. START WHEN FINISHES 100 MCG AND 25 MCG TABLETS 30 tablet 0  . sertraline (ZOLOFT) 50 MG tablet Take 1.5 tablets (75 mg total) by mouth daily. 45 tablet 6  . valACYclovir  (VALTREX) 500 MG tablet Take 1 tablet (500 mg total) by mouth 2 (two) times daily. 10 tablet 3   No current facility-administered medications on file prior to visit.     Allergies  Allergen Reactions  . Codeine Hives    Social History   Social History  . Marital status: Married    Spouse name: N/A  . Number of children: N/A  . Years of education: N/A   Occupational History  . Not on file.   Social History Main Topics  . Smoking status: Never Smoker  . Smokeless tobacco: Not on file  . Alcohol use No  . Drug use: No  . Sexual activity: Yes    Birth control/ protection: Condom, IUD   Other Topics Concern  . Not on file   Social History Narrative  . No narrative on file    Family History  Problem Relation Age of Onset  . Thyroid disease Mother   . Thyroid disease Maternal Grandmother   . Cancer Paternal Grandfather     lung    The following portions of the patient's history were reviewed and updated as appropriate: allergies, current medications, past family history, past medical history, past social history, past surgical history and problem list.  Review  of Systems ROS Review of Systems - General ROS: negative for - chills, fatigue, fever, hot flashes, night sweats, weight gain or weight loss Psychological ROS: negative for - anxiety, decreased libido, depression, mood swings, physical abuse or sexual abuse Ophthalmic ROS: negative for - blurry vision, eye pain or loss of vision ENT ROS: negative for - headaches, hearing change, visual changes or vocal changes Allergy and Immunology ROS: negative for - hives, itchy/watery eyes or seasonal allergies Hematological and Lymphatic ROS: negative for - bleeding problems, bruising, swollen lymph nodes or weight loss Endocrine ROS: negative for - galactorrhea, hair pattern changes, hot flashes, malaise/lethargy, mood swings, palpitations, polydipsia/polyuria, skin changes, temperature intolerance or unexpected weight  changes Breast ROS: negative for - new or changing breast lumps or nipple discharge Respiratory ROS: negative for - cough or shortness of breath Cardiovascular ROS: negative for - chest pain, irregular heartbeat, palpitations or shortness of breath Gastrointestinal ROS: no abdominal pain, change in bowel habits, or black or bloody stools Genito-Urinary ROS: no dysuria, trouble voiding, or hematuria Musculoskeletal ROS: negative for - joint pain or joint stiffness Neurological ROS: negative for - bowel and bladder control changes Dermatological ROS: negative for rash and skin lesion changes   Objective:   There were no vitals taken for this visit. CONSTITUTIONAL: Well-developed, well-nourished female in no acute distress.  PSYCHIATRIC: Normal mood and affect. Normal behavior. Normal judgment and thought content. [place]: Alert and oriented to person, place, and time. Normal muscle tone coordination. No cranial nerve deficit noted. HENT:  Normocephalic, atraumatic, External right and left ear normal. Oropharynx is clear and moist EYES: Conjunctivae and EOM are normal. Pupils are equal, round, and reactive to light. No scleral icterus.  NECK: Normal range of motion, supple, no masses.  Normal thyroid.  SKIN: Skin is warm and dry. No rash noted. Not diaphoretic. No erythema. No pallor. CARDIOVASCULAR: Normal heart rate noted, regular rhythm, no murmur. RESPIRATORY: Clear to auscultation bilaterally. Effort and breath sounds normal, no problems with respiration noted. BREASTS: Symmetric in size. No masses, skin changes, nipple drainage, or lymphadenopathy. ABDOMEN: Soft, normal bowel sounds, no distention noted.  No tenderness, rebound or guarding.  BLADDER: Normal PELVIC:  External Genitalia: Normal  BUS: Normal  Vagina: Normal  Cervix: Normal; No lesions  Uterus: Normal; midplane, normal size and shape, mobile, nontender  Adnexa: Normal  RV: External Exam NormaI  MUSCULOSKELETAL:  Normal range of motion. No tenderness.  No cyanosis, clubbing, or edema.  2+ distal pulses. LYMPHATIC: No Axillary, Supraclavicular, or Inguinal Adenopathy.         Assessment:   Annual gynecologic examination 26 y.o. Contraception:iud paragard bmi-31  Plan:  Pap: Pap, Reflex if ASCUS Mammogram: Not Indicated Stool Guaiac Testing:  Not Indicated Labs: thru pcp Routine preventative health maintenance measures emphasized: Exercise/Diet/Weight control, Tobacco Warnings and Alcohol/Substance use risks Return to [place], [place]   Note: This dictation was prepared with Dragon dictation along with smaller phrase technology. Any transcriptional errors that result from this process are unintentional.

## 2016-10-08 ENCOUNTER — Encounter: Payer: 59 | Admitting: Obstetrics and Gynecology

## 2016-10-10 ENCOUNTER — Encounter: Payer: Self-pay | Admitting: Gynecology

## 2016-10-10 ENCOUNTER — Ambulatory Visit
Admission: EM | Admit: 2016-10-10 | Discharge: 2016-10-10 | Disposition: A | Discharge status: Home / Self Care | Payer: BLUE CROSS/BLUE SHIELD | Attending: Family Medicine | Admitting: Family Medicine

## 2016-10-10 DIAGNOSIS — J029 Acute pharyngitis, unspecified: Secondary | ICD-10-CM | POA: Diagnosis not present

## 2016-10-10 LAB — CBC WITH DIFFERENTIAL/PLATELET
BASOS ABS: 0.1 10*3/uL (ref 0–0.1)
BASOS PCT: 1 %
EOS ABS: 0.1 10*3/uL (ref 0–0.7)
Eosinophils Relative: 1 %
HCT: 43.4 % (ref 35.0–47.0)
HEMOGLOBIN: 14.6 g/dL (ref 12.0–16.0)
Lymphocytes Relative: 27 %
Lymphs Abs: 2.4 10*3/uL (ref 1.0–3.6)
MCH: 30.5 pg (ref 26.0–34.0)
MCHC: 33.6 g/dL (ref 32.0–36.0)
MCV: 90.8 fL (ref 80.0–100.0)
MONO ABS: 0.7 10*3/uL (ref 0.2–0.9)
MONOS PCT: 8 %
NEUTROS PCT: 63 %
Neutro Abs: 5.7 10*3/uL (ref 1.4–6.5)
Platelets: 404 10*3/uL (ref 150–440)
RBC: 4.78 MIL/uL (ref 3.80–5.20)
RDW: 13.8 % (ref 11.5–14.5)
WBC: 9 10*3/uL (ref 3.6–11.0)

## 2016-10-10 LAB — MONONUCLEOSIS SCREEN: MONO SCREEN: NEGATIVE

## 2016-10-10 LAB — RAPID STREP SCREEN (MED CTR MEBANE ONLY): STREPTOCOCCUS, GROUP A SCREEN (DIRECT): NEGATIVE

## 2016-10-10 MED ORDER — AMOXICILLIN 875 MG PO TABS: 875.0000 mg | ORAL_TABLET | Freq: Two times a day (BID) | ORAL | 0 refills | Status: DC

## 2016-10-10 NOTE — ED Triage Notes (Signed)
Per patient nasal drainage and sore throat.Marland Kitchen

## 2016-10-10 NOTE — Discharge Instructions (Signed)
Take medication as prescribed. Rest. Drink plenty of fluids.  ° °Follow up with your primary care physician this week as needed. Return to Urgent care for new or worsening concerns.  ° °

## 2016-10-10 NOTE — ED Provider Notes (Signed)
[facility] ____________________________________________  Time seen: Approximately 4:38 PM  I have reviewed the triage vital signs and the nursing notes.   HISTORY  Chief Complaint Sore Throat   HPI Lindsay Ramos is a 26 y.o. female  patient presenting for the complaints of sore throat. Patient reports she also has some postnasal drainage, but reports this is a chronic issue for her for her seasonal allergies and reports unchanged. Denies any sinus pain or sinus pressure or increased nasal drainage or cough. Patient reports noticing sore throat 1 day. Patient reports this morning noticing white splotches in the back of her throat. States mild sore throat at this time. Denies fevers. Reports otherwise feels well. Reports daughter at home with vomiting and diarrhea.  Denies chest pain, shortness of breath, abdominal pain, dysuria, extremity pain, extremity swelling or rash. Denies recent sickness. Denies recent antibiotic use. Denies immunosuppression.   Patient's last menstrual period was 10/01/2016.Denies pregnancy.    Past Medical History:  Diagnosis Date  . Cancer ([place])    Thyroid  . Family history of adverse reaction to anesthesia    pts mom has stopped breathing   . Thyroid cancer [facility])     Patient Active Problem List   Diagnosis Date Noted  . Anxiety 11/26/2015  . Stress due to family tension 11/26/2015  . Contraception, device intrauterine 11/21/2015  . Thyroid carcinoma ([place]) 01/01/2015  . 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 Fatigue 08/01/2012    Past Surgical History:  Procedure Laterality Date  . APPENDECTOMY    . CESAREAN SECTION    . LYMPH NODE BIOPSY    . thyriod cancer    . THYROIDECTOMY N/A 01/01/2015   Procedure: THYROIDECTOMY, total with laryngeal nerve monitoring ;  Surgeon: Beverly Gust, MD;  Location: [facility] ORS;  Service: ENT;  Laterality: N/A;     No current facility-administered medications for this  encounter.   Current Outpatient Prescriptions:  .  ibuprofen (ADVIL,MOTRIN) 800 MG tablet, Take 1 tablet (800 mg total) by mouth every 8 (eight) hours as needed., Disp: 60 tablet, Rfl: 1 .  levothyroxine (SYNTHROID, LEVOTHROID) 125 MCG tablet, TAKE 1 TABLET(125 MCG) BY MOUTH DAILY BEFORE BREAKFAST. START WHEN FINISHES 100 MCG AND 25 MCG TABLETS, Disp: 30 tablet, Rfl: 0 .  valACYclovir (VALTREX) 500 MG tablet, Take 1 tablet (500 mg total) by mouth 2 (two) times daily., Disp: 10 tablet, Rfl: 3 .  amoxicillin (AMOXIL) 875 MG tablet, Take 1 tablet (875 mg total) by mouth 2 (two) times daily., Disp: 20 tablet, Rfl: 0 .  fluconazole (DIFLUCAN) 150 MG tablet, Take 1 tablet (150 mg total) by mouth daily., Disp: 1 tablet, Rfl: 0 .  sertraline (ZOLOFT) 50 MG tablet, Take 1.5 tablets (75 mg total) by mouth daily., Disp: 45 tablet, Rfl: 6  Allergies Codeine  Family History  Problem Relation Age of Onset  . Thyroid disease Mother   . Thyroid disease Maternal Grandmother   . Cancer Paternal Grandfather     lung    Social History Social History  Substance Use Topics  . Smoking status: Never Smoker  . Smokeless tobacco: Never Used  . Alcohol use No    Review of Systems Constitutional: No fever/chills Eyes: No visual changes. ENT: Positive sore throat. Cardiovascular: Denies chest pain. Respiratory: Denies shortness of breath. Gastrointestinal: No abdominal pain.  No nausea, no vomiting.  No diarrhea.  No constipation. Genitourinary: Negative for dysuria. Musculoskeletal: Negative for back pain. Skin: Negative for rash. Neurological: Negative for  headaches, focal weakness or numbness.  10-point ROS otherwise negative.  ____________________________________________   PHYSICAL EXAM:  VITAL SIGNS: ED Triage Vitals  Enc Vitals Group     BP 10/10/16 1559 132/79     Pulse Rate 10/10/16 1559 82     Resp 10/10/16 1559 16     Temp 10/10/16 1559 98 F (36.7 C)     Temp Source 10/10/16  1559 Oral     SpO2 10/10/16 1559 100 %     Weight 10/10/16 1600 205 lb (93 kg)     Height 10/10/16 1600 5\' 7"  (1.702 m)     Head Circumference --      Peak Flow --      Pain Score 10/10/16 1602 2     Pain Loc --      Pain Edu? --      Excl. in [place]? --    Constitutional: Alert and oriented. Well appearing and in no acute distress. Eyes: Conjunctivae are normal. PERRL. EOMI. Head: Atraumatic. No sinus tenderness to palpation. No swelling. No erythema.  Ears: no erythema, normal TMs bilaterally.   Nose: Nasal congestion or rhinorrhea.  Mouth/Throat: Mucous membranes are moist. Mild to moderate pharyngeal erythema with bilateral mild tonsillar swelling. Bilateral exudative tonsils. No uvular shift or deviation. Neck: No stridor. No cervical spine tenderness to palpation. Hematological/Lymphatic/Immunilogical: Mild anterior bilateral cervical lymphadenopathy. Cardiovascular: Normal rate, regular rhythm. Grossly normal heart sounds.  Good peripheral circulation. Respiratory: Normal respiratory effort.  No retractions. No wheezes, rales or rhonchi. Good air movement.  Gastrointestinal: Soft and nontender.  No CVA tenderness. No hepatosplenomegaly palpated. Musculoskeletal: Ambulatory with steady gait. No cervical, thoracic or lumbar tenderness to palpation. Neurologic:  Normal speech and language. No gait instability. Skin:  Skin appears warm, dry and intact. No rash noted. Psychiatric: Mood and affect are normal. Speech and behavior are normal.  ___________________________________________   LABS (all labs ordered are listed, but only abnormal results are displayed)  Labs Reviewed  RAPID STREP SCREEN (NOT AT [facility])  CULTURE, GROUP A STREP ([place])  MONONUCLEOSIS SCREEN  CBC WITH DIFFERENTIAL/PLATELET   ____________________________________________   PROCEDURES Procedures    INITIAL IMPRESSION / ASSESSMENT AND PLAN / ED COURSE  Pertinent labs & imaging results that were available  during my care of the patient were reviewed by me and considered in my medical decision making (see chart for details).  Well-appearing patient. No acute distress. Presents with complaints of sore throat. Exudative tonsils noted. Quick strep negative, will culture. Discussed evaluation of mono. Mono negative, CBC unremarkable. Discussed with patient as mono negative, concerned for streptococcal pharyngitis with false-negative quick strep. Will treat patient with oral amoxicillin. Encouraged rest, fluids and supportive care. Discussed indication, risks and benefits of medications with patient.  Discussed follow up with Primary care physician this week. Discussed follow up and return parameters including no resolution or any worsening concerns. Patient verbalized understanding and agreed to plan.   ____________________________________________   FINAL CLINICAL IMPRESSION(S) / ED DIAGNOSES  Final diagnoses:  Pharyngitis, unspecified etiology     Discharge Medication List as of 10/10/2016  5:24 PM    START taking these medications   Details  amoxicillin (AMOXIL) 875 MG tablet Take 1 tablet (875 mg total) by mouth 2 (two) times daily., Starting Sat 10/10/2016, Normal        Note: This dictation was prepared with Dragon dictation along with smaller phrase technology. Any transcriptional errors that result from this process are unintentional.  Marylene Land, NP 10/10/16 [place], NP 10/10/16 [phone]

## 2016-10-12 LAB — CULTURE, GROUP A STREP (THRC)

## 2016-10-23 ENCOUNTER — Encounter: Payer: Self-pay | Admitting: Obstetrics and Gynecology

## 2016-10-23 ENCOUNTER — Ambulatory Visit (INDEPENDENT_AMBULATORY_CARE_PROVIDER_SITE_OTHER): Payer: BLUE CROSS/BLUE SHIELD | Admitting: Obstetrics and Gynecology

## 2016-10-23 VITALS — BP 120/74 | HR 98 | Ht 67.0 in | Wt 208.8 lb

## 2016-10-23 DIAGNOSIS — E669 Obesity, unspecified: Secondary | ICD-10-CM

## 2016-10-23 DIAGNOSIS — E66811 Obesity, class 1: Secondary | ICD-10-CM

## 2016-10-23 MED ORDER — PHENDIMETRAZINE TARTRATE 35 MG PO TABS: 1.0000 | ORAL_TABLET | Freq: Every day | ORAL | 2 refills | Status: DC

## 2016-10-23 MED ORDER — CYANOCOBALAMIN 1000 MCG/ML IJ SOLN: 1000.0000 ug | INTRAMUSCULAR | 1 refills | Status: DC

## 2016-10-23 NOTE — Progress Notes (Deleted)
Patient ID: Lindsay Ramos, female   DOB: 1991-08-15, 26 y.o.   MRN: [id number] ANNUAL PREVENTATIVE CARE GYN  ENCOUNTER NOTE  Subjective:       Lindsay Ramos is a 26 y.o. G1P0 female here for a routine annual gynecologic exam.  Current complaints: 1      Significant history for thyroid cancer; status post thyroidectomy and I-131 therapy.  Patient is not to get pregnant for at least the next 2 years.  Gynecologic History Patient's last menstrual period was 10/01/2016. Contraception iud- paragard inserted 11/21/15 Last Pap: 11/21/2015 neg. Results were: normal Last mammogram: n/a. Results were: n/a  Obstetric History OB History  Gravida Para Term Preterm AB Living  1         1  SAB TAB Ectopic Multiple Live Births          1    # Outcome Date GA Lbr Len/2nd Weight Sex Delivery Anes PTL Lv  1 Gravida 03/08/13    F CS-Unspec   LIV      Past Medical History:  Diagnosis Date  . Cancer ([place])    Thyroid  . Family history of adverse reaction to anesthesia    pts mom has stopped breathing   . Thyroid cancer [facility])     Past Surgical History:  Procedure Laterality Date  . APPENDECTOMY    . CESAREAN SECTION    . LYMPH NODE BIOPSY    . thyriod cancer    . THYROIDECTOMY N/A 01/01/2015   Procedure: THYROIDECTOMY, total with laryngeal nerve monitoring ;  Surgeon: Beverly Gust, MD;  Location: [facility] ORS;  Service: ENT;  Laterality: N/A;    Current Outpatient Prescriptions on File Prior to Visit  Medication Sig Dispense Refill  . amoxicillin (AMOXIL) 875 MG tablet Take 1 tablet (875 mg total) by mouth 2 (two) times daily. (Patient not taking: Reported on 10/23/2016) 20 tablet 0  . cyanocobalamin (,VITAMIN B-12,) 1000 MCG/ML injection Inject 1 mL (1,000 mcg total) into the muscle every 30 (thirty) days. 10 mL 1  . fluconazole (DIFLUCAN) 150 MG tablet Take 1 tablet (150 mg total) by mouth daily. (Patient not taking: Reported on 10/23/2016) 1 tablet 0  . ibuprofen (ADVIL,MOTRIN)  800 MG tablet Take 1 tablet (800 mg total) by mouth every 8 (eight) hours as needed. (Patient not taking: Reported on 10/23/2016) 60 tablet 1  . levothyroxine (SYNTHROID, LEVOTHROID) 150 MCG tablet Take 150 mcg by mouth daily before breakfast.    . PARAGARD INTRAUTERINE COPPER IU by Intrauterine route.    . Phendimetrazine Tartrate 35 MG TABS Take 1 tablet (35 mg total) by mouth daily. 30 each 2  . sertraline (ZOLOFT) 50 MG tablet Take 1.5 tablets (75 mg total) by mouth daily. (Patient not taking: Reported on 10/23/2016) 45 tablet 6  . valACYclovir (VALTREX) 500 MG tablet Take 1 tablet (500 mg total) by mouth 2 (two) times daily. 10 tablet 3   No current facility-administered medications on file prior to visit.     Allergies  Allergen Reactions  . Codeine Hives    Social History   Social History  . Marital status: Married    Spouse name: N/A  . Number of children: N/A  . Years of education: N/A   Occupational History  . Not on file.   Social History Main Topics  . Smoking status: Never Smoker  . Smokeless tobacco: Never Used  . Alcohol use No  . Drug use: No  . Sexual activity: Yes  Birth control/ protection: IUD     Comment: paragard   Other Topics Concern  . Not on file   Social History Narrative  . No narrative on file    Family History  Problem Relation Age of Onset  . Thyroid disease Mother   . Thyroid disease Maternal Grandmother   . Cancer Paternal Grandfather     lung    The following portions of the patient's history were reviewed and updated as appropriate: allergies, current medications, past family history, past medical history, past social history, past surgical history and problem list.  Review of Systems ROS   Objective:   LMP CONSTITUTIONAL: Well-developed, well-nourished female in no acute distress.  PSYCHIATRIC: Normal mood and affect. Normal behavior. Normal judgment and thought content. [place]: Alert and oriented to person, place, and  time. Normal muscle tone coordination. No cranial nerve deficit noted. HENT:  Normocephalic, atraumatic, External right and left ear normal. Oropharynx is clear and moist EYES: Conjunctivae and EOM are normal. Pupils are equal, round, and reactive to light. No scleral icterus.  NECK: Normal range of motion, supple, no masses.  Normal thyroid.  SKIN: Skin is warm and dry. No rash noted. Not diaphoretic. No erythema. No pallor. CARDIOVASCULAR: Normal heart rate noted, regular rhythm, no murmur. RESPIRATORY: Clear to auscultation bilaterally. Effort and breath sounds normal, no problems with respiration noted. BREASTS: Symmetric in size. No masses, skin changes, nipple drainage, or lymphadenopathy. ABDOMEN: Soft, normal bowel sounds, no distention noted.  No tenderness, rebound or guarding.  BLADDER: Normal PELVIC:  External Genitalia: Normal  BUS: Normal  Vagina: Normal  Cervix: Normal; No lesions  Uterus: Normal; midplane, normal size and shape, mobile, nontender  Adnexa: Normal  RV: External Exam NormaI  MUSCULOSKELETAL: Normal range of motion. No tenderness.  No cyanosis, clubbing, or edema.  2+ distal pulses. LYMPHATIC: No Axillary, Supraclavicular, or Inguinal Adenopathy.         Assessment:   Annual gynecologic examination 26 y.o. Contraception:iud paragard bmi-31  Plan:  Pap: Pap, Reflex if ASCUS Mammogram: Not Indicated Stool Guaiac Testing:  Not Indicated Labs: thru pcp Routine preventative health maintenance measures emphasized: Exercise/Diet/Weight control, Tobacco Warnings and Alcohol/Substance use risks Return to [place], [place]   Note: This dictation was prepared with Dragon dictation along with smaller phrase technology. Any transcriptional errors that result from this process are unintentional.

## 2016-10-23 NOTE — Progress Notes (Signed)
Subjective:  Kaliyha Zamarripa is a 26 y.o. G1P0 at Unknown being seen today for weight loss management- initial visit.  Patient reports General ROS: negative and reports previous weight loss attempts:  Onset was gradual over 2-3year(s) . Tried phentermine last year for two weeks, stopped due to insomnia and thyroid cancer, has had stable thyroid levels since July 2017 and desires trying medication again. She is already drinking water as directed and exercising. .    Pertinent medical history includes: thyroid cancer   The patient has a surgical history of: thyroidectomy,    The following portions of the patient's history were reviewed and updated as appropriate: allergies, current medications, past family history, past medical history, past social history, past surgical history and problem list.   Objective:   Vitals:   10/23/16 0946  BP: 120/74  Pulse: 98  Weight: 208 lb 12.8 oz (94.7 kg)  Height: 5\' 7"  (1.702 m)    General:  Alert, oriented and cooperative. Patient is in no acute distress.  :   :   :   :   :   :   PE: Well groomed female in no current distress,   Mental Status: Normal mood and affect. Normal behavior. Normal judgment and thought content.   Current BMI: Body mass index is 32.7 kg/m.   Assessment and Plan:  Obesity  There are no diagnoses linked to this encounter.  Plan: low carb, High protein diet RX for adipex 37.5 mg daily and B12 1073mcg.ml monthly, to start now with first injection given at today's visit. Reviewed side-effects common to both medications and expected outcomes. Increase daily water intake to at least 8 bottle a day, every day.  Goal is to reduse weight by 10% by end of three months, and will re-evaluate then.  RTC in 4 weeks for Nurse visit to check weight & BP, and get next B12 injections.    Please refer to After Visit Summary for other counseling recommendations.    Kolt Mcwhirter N Hughes, CNM   Autymn Omlor [employer],  CNM      Consider the Low Glycemic Index Diet and 6 smaller meals daily .  This boosts your metabolism and regulates your sugars:   Use the protein bar by [employer]kins because they have lots of fiber in them  Find the low carb flatbreads, tortillas and pita breads for sandwiches:  Joseph's makes a pita bread and a flat bread , available at [facility] and [employer]; Commerce makes a low carb flatbread available at [employer] and HT that is 9 net carbs and 100 cal [place] makes a low carb whole wheat tortilla available at [employer] most grocery stores with 6 net carbs and 210 cal  [place] yogurt can still have a lot of carbs .  Dannon Light N fit has 80 cal and 8 carbs

## 2016-10-27 ENCOUNTER — Encounter: Payer: BLUE CROSS/BLUE SHIELD | Admitting: Obstetrics and Gynecology

## 2016-11-02 ENCOUNTER — Telehealth: Payer: Self-pay | Admitting: Obstetrics and Gynecology

## 2016-11-02 MED ORDER — FLUCONAZOLE 150 MG PO TABS: 150.0000 mg | ORAL_TABLET | Freq: Every day | ORAL | 0 refills | Status: DC

## 2016-11-02 NOTE — Telephone Encounter (Signed)
Patient called because she needs a Diflucan called in, she was given something for strep throat last week and now she has a yeast infection. Patient uses the [facility] in [place]. Please advise.

## 2016-11-02 NOTE — Telephone Encounter (Signed)
Possible y/i- pos itch, d/c, and burning. Pt states she had atb last week. Erx diflucan.

## 2016-11-12 NOTE — Progress Notes (Signed)
Patient ID: Lindsay Ramos, female   DOB: [birth year], 26 y.o.   MRN: [id number] ANNUAL PREVENTATIVE CARE GYN  ENCOUNTER NOTE  Subjective:       Lindsay Ramos is a 26 y.o. G1P1 female here for a routine annual gynecologic exam.  Current complaints: 1. none    Significant history for thyroid cancer; status post thyroidectomy and I-131 therapy.  Patient is not to get pregnant for at least the next 2 years.  Menstrual cycles are regular on a monthly basis, heavy lasting 3-4 days. No need for medications for dysmenorrhea control No intermenstrual bleeding or postcoital bleeding. Patient no longer married Bowel and bladder function are normal.  Gynecologic History lmp- 10/29/2016 Contraception iud- paragard inserted 11/21/15 Last Pap: 11/21/2015 neg. Results were: normal Last mammogram: n/a. Results were: n/a  Obstetric History OB History  Gravida Para Term Preterm AB Living  1         1  SAB TAB Ectopic Multiple Live Births          1    # Outcome Date GA Lbr Len/2nd Weight Sex Delivery Anes PTL Lv  1 Gravida 03/08/13    F CS-Unspec   LIV      Past Medical History:  Diagnosis Date  . Cancer ([place])    Thyroid  . Family history of adverse reaction to anesthesia    pts mom has stopped breathing   . Thyroid cancer [facility])     Past Surgical History:  Procedure Laterality Date  . APPENDECTOMY    . CESAREAN SECTION    . LYMPH NODE BIOPSY    . thyriod cancer    . THYROIDECTOMY N/A 01/01/2015   Procedure: THYROIDECTOMY, total with laryngeal nerve monitoring ;  Surgeon: Beverly Gust, MD;  Location: [facility] ORS;  Service: ENT;  Laterality: N/A;    Current Outpatient Prescriptions on File Prior to Visit  Medication Sig Dispense Refill  . amoxicillin (AMOXIL) 875 MG tablet Take 1 tablet (875 mg total) by mouth 2 (two) times daily. (Patient not taking: Reported on 10/23/2016) 20 tablet 0  . cyanocobalamin (,VITAMIN B-12,) 1000 MCG/ML injection Inject 1 mL (1,000 mcg total)  into the muscle every 30 (thirty) days. 10 mL 1  . fluconazole (DIFLUCAN) 150 MG tablet Take 1 tablet (150 mg total) by mouth daily. (Patient not taking: Reported on 10/23/2016) 1 tablet 0  . fluconazole (DIFLUCAN) 150 MG tablet Take 1 tablet (150 mg total) by mouth daily. 1 tablet 0  . ibuprofen (ADVIL,MOTRIN) 800 MG tablet Take 1 tablet (800 mg total) by mouth every 8 (eight) hours as needed. (Patient not taking: Reported on 10/23/2016) 60 tablet 1  . levothyroxine (SYNTHROID, LEVOTHROID) 150 MCG tablet Take 150 mcg by mouth daily before breakfast.    . PARAGARD INTRAUTERINE COPPER IU by Intrauterine route.    . Phendimetrazine Tartrate 35 MG TABS Take 1 tablet (35 mg total) by mouth daily. 30 each 2  . sertraline (ZOLOFT) 50 MG tablet Take 1.5 tablets (75 mg total) by mouth daily. (Patient not taking: Reported on 10/23/2016) 45 tablet 6  . valACYclovir (VALTREX) 500 MG tablet Take 1 tablet (500 mg total) by mouth 2 (two) times daily. 10 tablet 3   No current facility-administered medications on file prior to visit.     Allergies  Allergen Reactions  . Codeine Hives    Social History   Social History  . Marital status: Married    Spouse name: N/A  . Number of children: N/A  .  Years of education: N/A   Occupational History  . Not on file.   Social History Main Topics  . Smoking status: Never Smoker  . Smokeless tobacco: Never Used  . Alcohol use No  . Drug use: No  . Sexual activity: Yes    Birth control/ protection: IUD     Comment: paragard   Other Topics Concern  . Not on file   Social History Narrative  . No narrative on file    Family History  Problem Relation Age of Onset  . Thyroid disease Mother   . Thyroid disease Maternal Grandmother   . Cancer Paternal Grandfather     lung    The following portions of the patient's history were reviewed and updated as appropriate: allergies, current medications, past family history, past medical history, past social history,  past surgical history and problem list.  Review of Systems Review of Systems  Constitutional: Negative.   HENT: Negative.   Eyes: Negative.   Respiratory: Negative.   Cardiovascular: Negative.   Gastrointestinal: Negative for abdominal pain, constipation and diarrhea.  Genitourinary: Negative.        Menses have a 3-4 days total  Musculoskeletal: Negative.   Skin: Negative.   Neurological: Negative.   Endo/Heme/Allergies: Negative.   Psychiatric/Behavioral: Negative.      Objective:   BP 104/72   Pulse 91   Ht 5\' 7"  (1.702 m)   Wt 209 lb 14.4 oz (95.2 kg)   LMP 10/29/2016 (Approximate)   BMI 32.87 kg/m  CONSTITUTIONAL: Well-developed, well-nourished female in no acute distress.  PSYCHIATRIC: Normal mood and affect. Normal behavior. Normal judgment and thought content. [place]: Alert and oriented to person, place, and time. Normal muscle tone coordination. No cranial nerve deficit noted. HENT:  Normocephalic, atraumatic, External right and left ear normal. Oropharynx is clear and moist EYES: Conjunctivae and EOM are normal. Pupils are equal, round, and reactive to light. No scleral icterus.  NECK: Normal range of motion, supple, no masses.  Nonpalpable thyroid. Thyroidectomy scar healed SKIN: Skin is warm and dry. No rash noted. Not diaphoretic. No erythema. No pallor. CARDIOVASCULAR: Normal heart rate noted, regular rhythm, no murmur. RESPIRATORY: Clear to auscultation bilaterally. Effort and breath sounds normal, no problems with respiration noted. BREASTS: Symmetric in size. No masses, skin changes, nipple drainage, or lymphadenopathy. ABDOMEN: Soft, normal bowel sounds, no distention noted.  No tenderness, rebound or guarding.  BLADDER: Normal PELVIC:  External Genitalia: Normal  BUS: Normal  Vagina: Normal  Cervix: Normal; No lesions; IUD string 3 cm  Uterus: Normal; midplane, normal size and shape, mobile, nontender  Adnexa: Normal; nonpalpable and nontender  RV:  External Exam NormaI  MUSCULOSKELETAL: Normal range of motion. No tenderness.  No cyanosis, clubbing, or edema.  2+ distal pulses. LYMPHATIC: No Axillary, Supraclavicular, or Inguinal Adenopathy.         Assessment:   Annual gynecologic examination 26 y.o. Contraception:iud paragard [id number] History of recurrent thyroid cancer, currently asymptomatic Plan:  Pap: Pap, Reflex if ASCUS- with ch.gc Mammogram: Not Indicated Stool Guaiac Testing:  Not Indicated Labs: declines Routine preventative health maintenance measures emphasized: Exercise/Diet/Weight control, Tobacco Warnings, Alcohol/Substance use risks and Safe Sex Return to [place], [place]  Brayton Mars, MD   Note: This dictation was prepared with Dragon dictation along with smaller phrase technology. Any transcriptional errors that result from this process are unintentional.

## 2016-11-16 ENCOUNTER — Ambulatory Visit: Payer: BLUE CROSS/BLUE SHIELD

## 2016-11-17 ENCOUNTER — Ambulatory Visit: Payer: BLUE CROSS/BLUE SHIELD

## 2016-11-18 ENCOUNTER — Encounter: Payer: Self-pay | Admitting: Obstetrics and Gynecology

## 2016-11-18 ENCOUNTER — Ambulatory Visit (INDEPENDENT_AMBULATORY_CARE_PROVIDER_SITE_OTHER): Payer: BLUE CROSS/BLUE SHIELD | Admitting: Obstetrics and Gynecology

## 2016-11-18 VITALS — BP 104/72 | HR 91 | Ht 67.0 in | Wt 209.9 lb

## 2016-11-18 DIAGNOSIS — E6609 Other obesity due to excess calories: Secondary | ICD-10-CM | POA: Diagnosis not present

## 2016-11-18 DIAGNOSIS — Z202 Contact with and (suspected) exposure to infections with a predominantly sexual mode of transmission: Secondary | ICD-10-CM

## 2016-11-18 DIAGNOSIS — Z975 Presence of (intrauterine) contraceptive device: Secondary | ICD-10-CM | POA: Diagnosis not present

## 2016-11-18 DIAGNOSIS — Z6830 Body mass index (BMI) 30.0-30.9, adult: Secondary | ICD-10-CM

## 2016-11-18 DIAGNOSIS — Z638 Other specified problems related to primary support group: Secondary | ICD-10-CM

## 2016-11-18 DIAGNOSIS — F419 Anxiety disorder, unspecified: Secondary | ICD-10-CM

## 2016-11-18 DIAGNOSIS — E669 Obesity, unspecified: Secondary | ICD-10-CM

## 2016-11-18 DIAGNOSIS — Z01419 Encounter for gynecological examination (general) (routine) without abnormal findings: Secondary | ICD-10-CM

## 2016-11-18 NOTE — Patient Instructions (Signed)
1. Pap smear is completed 2. Self breast awareness encouraged 3. Continue with healthy eating and exercise with controlled weight loss 4. Contraception-ParaGard IUD 5. Screening labs to be done through oncologist 6. Safe sex practices 7. Return in 1 year for annual exam  Health Maintenance, Female Adopting a healthy lifestyle and getting preventive care can go a long way to promote health and wellness. Talk with your health care provider about what schedule of regular examinations is right for you. This is a good chance for you to check in with your provider about disease prevention and staying healthy. In between checkups, there are plenty of things you can do on your own. Experts have done a lot of research about which lifestyle changes and preventive measures are most likely to keep you healthy. Ask your health care provider for more information. Weight and diet Eat a healthy diet  Be sure to include plenty of vegetables, fruits, low-fat dairy products, and lean protein.  Do not eat a lot of foods high in solid fats, added sugars, or salt.  Get regular exercise. This is one of the most important things you can do for your health.  Most adults should exercise for at least 150 minutes each week. The exercise should increase your heart rate and make you sweat (moderate-intensity exercise).  Most adults should also do strengthening exercises at least twice a week. This is in addition to the moderate-intensity exercise. Maintain a healthy weight  Body mass index (BMI) is a measurement that can be used to identify possible weight problems. It estimates body fat based on height and weight. Your health care provider can help determine your BMI and help you achieve or maintain a healthy weight.  For females 86 years of age and older:  A BMI below 18.5 is considered underweight.  A BMI of 18.5 to 24.9 is normal.  A BMI of 25 to 29.9 is considered overweight.  A BMI of 30 and above is  considered obese. Watch levels of cholesterol and blood lipids  You should start having your blood tested for lipids and cholesterol at 28 years of age, then have this test every 5 years.  You may need to have your cholesterol levels checked more often if:  Your lipid or cholesterol levels are high.  You are older than 28 years of age.  You are at high risk for heart disease. Cancer screening Lung Cancer  Lung cancer screening is recommended for adults 84-50 years old who are at high risk for lung cancer because of a history of smoking.  A yearly low-dose CT scan of the lungs is recommended for people who:  Currently smoke.  Have quit within the past 15 years.  Have at least a 30-pack-year history of smoking. A pack year is smoking an average of one pack of cigarettes a day for 1 year.  Yearly screening should continue until it has been 15 years since you quit.  Yearly screening should stop if you develop a health problem that would prevent you from having lung cancer treatment. Breast Cancer  Practice breast self-awareness. This means understanding how your breasts normally appear and feel.  It also means doing regular breast self-exams. Let your health care provider know about any changes, no matter how small.  If you are in your 20s or 30s, you should have a clinical breast exam (CBE) by a health care provider every 1-3 years as part of a regular health exam.  If you are 40 or  or older, have a CBE every year. Also consider having a breast X-ray (mammogram) every year.  If you have a family history of breast cancer, talk to your health care provider about genetic screening.  If you are at high risk for breast cancer, talk to your health care provider about having an MRI and a mammogram every year.  Breast cancer gene (BRCA) assessment is recommended for women who have family members with BRCA-related cancers. BRCA-related cancers  include:  Breast.  Ovarian.  Tubal.  Peritoneal cancers.  Results of the assessment will determine the need for genetic counseling and BRCA1 and BRCA2 testing. Cervical Cancer  Your health care provider may recommend that you be screened regularly for cancer of the pelvic organs (ovaries, uterus, and vagina). This screening involves a pelvic examination, including checking for microscopic changes to the surface of your cervix (Pap test). You may be encouraged to have this screening done every 3 years, beginning at age 21.  For women ages 30-65, health care providers may recommend pelvic exams and Pap testing every 3 years, or they may recommend the Pap and pelvic exam, combined with testing for human papilloma virus (HPV), every 5 years. Some types of HPV increase your risk of cervical cancer. Testing for HPV may also be done on women of any age with unclear Pap test results.  Other health care providers may not recommend any screening for nonpregnant women who are considered low risk for pelvic cancer and who do not have symptoms. Ask your health care provider if a screening pelvic exam is right for you.  If you have had past treatment for cervical cancer or a condition that could lead to cancer, you need Pap tests and screening for cancer for at least 20 years after your treatment. If Pap tests have been discontinued, your risk factors (such as having a new sexual partner) need to be reassessed to determine if screening should resume. Some women have medical problems that increase the chance of getting cervical cancer. In these cases, your health care provider may recommend more frequent screening and Pap tests. Colorectal Cancer  This type of cancer can be detected and often prevented.  Routine colorectal cancer screening usually begins at 28 years of age and continues through 28 years of age.  Your health care provider may recommend screening at an earlier age if you have risk factors  for colon cancer.  Your health care provider may also recommend using home test kits to check for hidden blood in the stool.  A small camera at the end of a tube can be used to examine your colon directly (sigmoidoscopy or colonoscopy). This is done to check for the earliest forms of colorectal cancer.  Routine screening usually begins at age 50.  Direct examination of the colon should be repeated every 5-10 years through 28 years of age. However, you may need to be screened more often if early forms of precancerous polyps or small growths are found. Skin Cancer  Check your skin from head to toe regularly.  Tell your health care provider about any new moles or changes in moles, especially if there is a change in a mole's shape or color.  Also tell your health care provider if you have a mole that is larger than the size of a pencil eraser.  Always use sunscreen. Apply sunscreen liberally and repeatedly throughout the day.  Protect yourself by wearing long sleeves, pants, a wide-brimmed hat, and sunglasses whenever you are outside. Heart   disease, diabetes, and high blood pressure  High blood pressure causes heart disease and increases the risk of stroke. High blood pressure is more likely to develop in:  People who have blood pressure in the high end of the normal range (130-139/85-89 mm Hg).  People who are overweight or obese.  People who are African American.  If you are 18-39 years of age, have your blood pressure checked every 3-5 years. If you are 40 years of age or older, have your blood pressure checked every year. You should have your blood pressure measured twice-once when you are at a hospital or clinic, and once when you are not at a hospital or clinic. Record the average of the two measurements. To check your blood pressure when you are not at a hospital or clinic, you can use:  An automated blood pressure machine at a pharmacy.  A home blood pressure monitor.  If you  are between 55 years and 79 years old, ask your health care provider if you should take aspirin to prevent strokes.  Have regular diabetes screenings. This involves taking a blood sample to check your fasting blood sugar level.  If you are at a normal weight and have a low risk for diabetes, have this test once every three years after 28 years of age.  If you are overweight and have a high risk for diabetes, consider being tested at a younger age or more often. Preventing infection Hepatitis B  If you have a higher risk for hepatitis B, you should be screened for this virus. You are considered at high risk for hepatitis B if:  You were born in a country where hepatitis B is common. Ask your health care provider which countries are considered high risk.  Your parents were born in a high-risk country, and you have not been immunized against hepatitis B (hepatitis B vaccine).  You have HIV or AIDS.  You use needles to inject street drugs.  You live with someone who has hepatitis B.  You have had sex with someone who has hepatitis B.  You get hemodialysis treatment.  You take certain medicines for conditions, including cancer, organ transplantation, and autoimmune conditions. Hepatitis C  Blood testing is recommended for:  Everyone born from 1945 through 1965.  Anyone with known risk factors for hepatitis C. Sexually transmitted infections (STIs)  You should be screened for sexually transmitted infections (STIs) including gonorrhea and chlamydia if:  You are sexually active and are younger than 28 years of age.  You are older than 28 years of age and your health care provider tells you that you are at risk for this type of infection.  Your sexual activity has changed since you were last screened and you are at an increased risk for chlamydia or gonorrhea. Ask your health care provider if you are at risk.  If you do not have HIV, but are at risk, it may be recommended that you  take a prescription medicine daily to prevent HIV infection. This is called pre-exposure prophylaxis (PrEP). You are considered at risk if:  You are sexually active and do not regularly use condoms or know the HIV status of your partner(s).  You take drugs by injection.  You are sexually active with a partner who has HIV. Talk with your health care provider about whether you are at high risk of being infected with HIV. If you choose to begin PrEP, you should first be tested for HIV. You should then be   every 3 months for as long as you are taking PrEP. Pregnancy  If you are premenopausal and you may become pregnant, ask your health care provider about preconception counseling.  If you may become pregnant, take 400 to 800 micrograms (mcg) of folic acid every day.  If you want to prevent pregnancy, talk to your health care provider about birth control (contraception). Osteoporosis and menopause  Osteoporosis is a disease in which the bones lose minerals and strength with aging. This can result in serious bone fractures. Your risk for osteoporosis can be identified using a bone density scan.  If you are 64 years of age or older, or if you are at risk for osteoporosis and fractures, ask your health care provider if you should be screened.  Ask your health care provider whether you should take a calcium or vitamin D supplement to lower your risk for osteoporosis.  Menopause may have certain physical symptoms and risks.  Hormone replacement therapy may reduce some of these symptoms and risks. Talk to your health care provider about whether hormone replacement therapy is right for you. Follow these instructions at home:  Schedule regular health, dental, and eye exams.  Stay current with your immunizations.  Do not use any tobacco products including cigarettes, chewing tobacco, or electronic cigarettes.  If you are pregnant, do not drink alcohol.  If you are breastfeeding, limit  how much and how often you drink alcohol.  Limit alcohol intake to no more than 1 drink per day for nonpregnant women. One drink equals 12 ounces of beer, 5 ounces of wine, or 1 ounces of hard liquor.  Do not use street drugs.  Do not share needles.  Ask your health care provider for help if you need support or information about quitting drugs.  Tell your health care provider if you often feel depressed.  Tell your health care provider if you have ever been abused or do not feel safe at home. This information is not intended to replace advice given to you by your health care provider. Make sure you discuss any questions you have with your health care provider. Document Released: 02/23/2011 Document Revised: 01/16/2016 Document Reviewed: 05/14/2015 Elsevier Interactive Patient Education  2017 [employer].

## 2016-11-26 LAB — PAP IG, CT-NG, RFX HPV ASCU
CHLAMYDIA, NUC. ACID AMP: NEGATIVE
Gonococcus by Nucleic Acid Amp: NEGATIVE
PAP SMEAR COMMENT: 0

## 2016-11-26 LAB — HPV DNA PROBE HIGH RISK, AMPLIFIED: HPV, HIGH-RISK: NEGATIVE

## 2016-12-07 ENCOUNTER — Encounter: Payer: Self-pay | Admitting: Obstetrics and Gynecology

## 2016-12-18 ENCOUNTER — Encounter: Payer: Self-pay | Admitting: Obstetrics and Gynecology

## 2016-12-22 ENCOUNTER — Other Ambulatory Visit: Payer: Self-pay

## 2016-12-22 ENCOUNTER — Encounter: Payer: Self-pay | Admitting: Obstetrics and Gynecology

## 2016-12-22 MED ORDER — BUPROPION HCL ER (XL) 150 MG PO TB24: 150.0000 mg | ORAL_TABLET | Freq: Every day | ORAL | 1 refills | Status: DC

## 2017-01-04 ENCOUNTER — Other Ambulatory Visit: Payer: Self-pay | Admitting: Obstetrics and Gynecology

## 2017-01-13 ENCOUNTER — Encounter: Payer: Self-pay | Admitting: Obstetrics and Gynecology

## 2017-02-04 ENCOUNTER — Encounter: Payer: BLUE CROSS/BLUE SHIELD | Admitting: Obstetrics and Gynecology

## 2017-02-09 ENCOUNTER — Encounter: Payer: BLUE CROSS/BLUE SHIELD | Admitting: Obstetrics and Gynecology

## 2017-03-01 ENCOUNTER — Other Ambulatory Visit: Payer: Self-pay

## 2017-03-01 ENCOUNTER — Encounter: Payer: Self-pay | Admitting: Obstetrics and Gynecology

## 2017-03-01 MED ORDER — FLUCONAZOLE 150 MG PO TABS: 150.0000 mg | ORAL_TABLET | Freq: Every day | ORAL | 0 refills | Status: DC

## 2017-04-14 ENCOUNTER — Other Ambulatory Visit: Payer: Self-pay | Admitting: Obstetrics and Gynecology

## 2017-11-19 NOTE — Progress Notes (Deleted)
Patient ID: Lindsay Ramos, female   DOB: September 02, 1990, 27 y.o.   MRN: [id number] ANNUAL PREVENTATIVE CARE GYN  ENCOUNTER NOTE  Subjective:       Lindsay Ramos is a 27 y.o. G1P1 female here for a routine annual gynecologic exam.  Current complaints: 1. none    Significant history for thyroid cancer; status post thyroidectomy and I-131 therapy.  Patient is not to get pregnant for at least the next 2 years.  Menstrual cycles are regular on a monthly basis, heavy lasting 3-4 days. No need for medications for dysmenorrhea control No intermenstrual bleeding or postcoital bleeding. Patient no longer married Bowel and bladder function are normal.  Gynecologic History lmp-  Contraception iud- paragard inserted 11/21/15 Last Pap: 10/2016 ascus/neg/neg/neg . Results were: normal Last mammogram: n/a. Results were: n/a  Obstetric History OB History  Gravida Para Term Preterm AB Living  1         1  SAB TAB Ectopic Multiple Live Births          1    # Outcome Date GA Lbr Len/2nd Weight Sex Delivery Anes PTL Lv  1 Gravida 03/08/13    F CS-Unspec   LIV    Past Medical History:  Diagnosis Date  . Cancer ([place])    Thyroid  . Family history of adverse reaction to anesthesia    pts mom has stopped breathing   . Thyroid cancer [facility])     Past Surgical History:  Procedure Laterality Date  . APPENDECTOMY    . CESAREAN SECTION    . LYMPH NODE BIOPSY    . thyriod cancer    . THYROIDECTOMY N/A 01/01/2015   Procedure: THYROIDECTOMY, total with laryngeal nerve monitoring ;  Surgeon: Beverly Gust, MD;  Location: [facility] ORS;  Service: ENT;  Laterality: N/A;    Current Outpatient Medications on File Prior to Visit  Medication Sig Dispense Refill  . buPROPion (WELLBUTRIN XL) 150 MG 24 hr tablet Take 1 tablet (150 mg total) by mouth daily. 30 tablet 1  . fluconazole (DIFLUCAN) 150 MG tablet Take 1 tablet (150 mg total) by mouth daily. 1 tablet 0  . ibuprofen (ADVIL,MOTRIN) 800 MG  tablet TAKE 1 TABLET(800 MG) BY MOUTH EVERY 8 HOURS AS NEEDED 60 tablet 0  . levothyroxine (SYNTHROID, LEVOTHROID) 150 MCG tablet Take 150 mcg by mouth daily before breakfast.    . PARAGARD INTRAUTERINE COPPER IU by Intrauterine route.    . valACYclovir (VALTREX) 500 MG tablet Take 1 tablet (500 mg total) by mouth 2 (two) times daily. 10 tablet 3   No current facility-administered medications on file prior to visit.     Allergies  Allergen Reactions  . Codeine Hives    Social History   Socioeconomic History  . Marital status: Married    Spouse name: Not on file  . Number of children: Not on file  . Years of education: Not on file  . Highest education level: Not on file  Occupational History  . Not on file  Social Needs  . Financial resource strain: Not on file  . Food insecurity:    Worry: Not on file    Inability: Not on file  . Transportation needs:    Medical: Not on file    Non-medical: Not on file  Tobacco Use  . Smoking status: Never Smoker  . Smokeless tobacco: Never Used  Substance and Sexual Activity  . Alcohol use: No  . Drug use: No  . Sexual activity:  Yes    Birth control/protection: IUD    Comment: paragard  Lifestyle  . Physical activity:    Days per week: Not on file    Minutes per session: Not on file  . Stress: Not on file  Relationships  . Social connections:    Talks on phone: Not on file    Gets together: Not on file    Attends religious service: Not on file    Active member of club or organization: Not on file    Attends meetings of clubs or organizations: Not on file    Relationship status: Not on file  . Intimate partner violence:    Fear of current or ex partner: Not on file    Emotionally abused: Not on file    Physically abused: Not on file    Forced sexual activity: Not on file  Other Topics Concern  . Not on file  Social History Narrative  . Not on file    Family History  Problem Relation Age of Onset  . Thyroid disease  Mother   . Thyroid disease Maternal Grandmother   . Cancer Paternal Grandfather        lung  . Diabetes Maternal Grandfather   . Breast cancer Neg Hx   . Ovarian cancer Neg Hx   . Colon cancer Neg Hx     The following portions of the patient's history were reviewed and updated as appropriate: allergies, current medications, past family history, past medical history, past social history, past surgical history and problem list.  Review of Systems    Objective:   There were no vitals taken for this visit. CONSTITUTIONAL: Well-developed, well-nourished female in no acute distress.  PSYCHIATRIC: Normal mood and affect. Normal behavior. Normal judgment and thought content. [place]: Alert and oriented to person, place, and time. Normal muscle tone coordination. No cranial nerve deficit noted. HENT:  Normocephalic, atraumatic, External right and left ear normal. Oropharynx is clear and moist EYES: Conjunctivae and EOM are normal. Pupils are equal, round, and reactive to light. No scleral icterus.  NECK: Normal range of motion, supple, no masses.  Nonpalpable thyroid. Thyroidectomy scar healed SKIN: Skin is warm and dry. No rash noted. Not diaphoretic. No erythema. No pallor. CARDIOVASCULAR: Normal heart rate noted, regular rhythm, no murmur. RESPIRATORY: Clear to auscultation bilaterally. Effort and breath sounds normal, no problems with respiration noted. BREASTS: Symmetric in size. No masses, skin changes, nipple drainage, or lymphadenopathy. ABDOMEN: Soft, normal bowel sounds, no distention noted.  No tenderness, rebound or guarding.  BLADDER: Normal PELVIC:  External Genitalia: Normal  BUS: Normal  Vagina: Normal  Cervix: Normal; No lesions; IUD string 3 cm  Uterus: Normal; midplane, normal size and shape, mobile, nontender  Adnexa: Normal; nonpalpable and nontender  RV: External Exam NormaI  MUSCULOSKELETAL: Normal range of motion. No tenderness.  No cyanosis, clubbing, or edema.   2+ distal pulses. LYMPHATIC: No Axillary, Supraclavicular, or Inguinal Adenopathy.         Assessment:   Annual gynecologic examination 27 y.o. Contraception:iud paragard [id number] History of recurrent thyroid cancer,  Plan:  Pap: Pap, Reflex if ASCUS-  Mammogram: Not Indicated Stool Guaiac Testing:  Not Indicated Labs: declines Routine preventative health maintenance measures emphasized: Exercise/Diet/Weight control, Tobacco Warnings, Alcohol/Substance use risks and Safe Sex Return to [place], [place]    Note: This dictation was prepared with Dragon dictation along with smaller phrase technology. Any transcriptional errors that result from this process are  unintentional.

## 2017-11-25 ENCOUNTER — Encounter: Payer: BLUE CROSS/BLUE SHIELD | Admitting: Obstetrics and Gynecology

## 2017-12-13 NOTE — Progress Notes (Signed)
Patient ID: Lindsay Ramos, female   DOB: [birth year], 27 y.o.   MRN: [id number] ANNUAL PREVENTATIVE CARE GYN  ENCOUNTER NOTE  Subjective:       Lindsay Ramos is a 27 y.o. G1P1 female here for a routine annual gynecologic exam.  Current complaints: 1. none    Significant history for thyroid cancer; status post thyroidectomy and I-131 therapy. 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  Patient does not have significant exercise tolerance at this time because of impaired right lung function; last pulse ox testing showed her  to have 94% saturation.  Menstrual cycles are regular on a monthly basis, heavy lasting 5 days. ParaGard IUD is in place. No need for medications for dysmenorrhea control No intermenstrual bleeding or postcoital bleeding. Patient no longer married Bowel and bladder function are normal.  Gynecologic History lmp- 11/26/2017 Contraception iud- paragard inserted 11/21/15 Last Pap: 10/2016 ascus/neg/neg/neg . Results were: normal Last mammogram: n/a. Results were: n/a  Obstetric History OB History  Gravida Para Term Preterm AB Living  1         1  SAB TAB Ectopic Multiple Live Births          1    # Outcome Date GA Lbr Len/2nd Weight Sex Delivery Anes PTL Lv  1 Gravida 03/08/13    F CS-Unspec   LIV    Past Medical History:  Diagnosis Date  . Cancer ([place])    Thyroid  . Family history of adverse reaction to anesthesia    pts mom has stopped breathing   . Thyroid cancer [facility])     Past Surgical History:  Procedure Laterality Date  . APPENDECTOMY    . CESAREAN SECTION    . LYMPH NODE BIOPSY    . thyriod cancer    . THYROIDECTOMY N/A 01/01/2015   Procedure: THYROIDECTOMY, total with laryngeal nerve monitoring ;  Surgeon: Beverly Gust, MD;  Location: [facility] ORS;  Service: ENT;   Laterality: N/A;    Current Outpatient Medications on File Prior to Visit  Medication Sig Dispense Refill  . buPROPion (WELLBUTRIN XL) 150 MG 24 hr tablet Take 1 tablet (150 mg total) by mouth daily. 30 tablet 1  . fluconazole (DIFLUCAN) 150 MG tablet Take 1 tablet (150 mg total) by mouth daily. 1 tablet 0  . ibuprofen (ADVIL,MOTRIN) 800 MG tablet TAKE 1 TABLET(800 MG) BY MOUTH EVERY 8 HOURS AS NEEDED 60 tablet 0  . levothyroxine (SYNTHROID, LEVOTHROID) 150 MCG tablet Take 150 mcg by mouth daily before breakfast.    . PARAGARD INTRAUTERINE COPPER IU by Intrauterine route.    . valACYclovir (VALTREX) 500 MG tablet Take 1 tablet (500 mg total) by mouth 2 (two) times daily. 10 tablet 3   No current facility-administered medications on file prior to visit.     Allergies  Allergen Reactions  . Codeine Hives    Social History   Socioeconomic History  . Marital status: Married    Spouse name: Not on file  . Number of children: Not on file  . Years of education: Not on file  . Highest education level: Not on file  Occupational History  . Not on file  Social Needs  . Financial resource strain: Not on file  . Food insecurity:    Worry: Not on file    Inability: Not on file  .  Transportation needs:    Medical: Not on file    Non-medical: Not on file  Tobacco Use  . Smoking status: Never Smoker  . Smokeless tobacco: Never Used  Substance and Sexual Activity  . Alcohol use: No  . Drug use: No  . Sexual activity: Yes    Birth control/protection: IUD    Comment: paragard  Lifestyle  . Physical activity:    Days per week: Not on file    Minutes per session: Not on file  . Stress: Not on file  Relationships  . Social connections:    Talks on phone: Not on file    Gets together: Not on file    Attends religious service: Not on file    Active member of club or organization: Not on file    Attends meetings of clubs or organizations: Not on file    Relationship status: Not on  file  . Intimate partner violence:    Fear of current or ex partner: Not on file    Emotionally abused: Not on file    Physically abused: Not on file    Forced sexual activity: Not on file  Other Topics Concern  . Not on file  Social History Narrative  . Not on file    Family History  Problem Relation Age of Onset  . Thyroid disease Mother   . Thyroid disease Maternal Grandmother   . Cancer Paternal Grandfather        lung  . Diabetes Maternal Grandfather   . Breast cancer Neg Hx   . Ovarian cancer Neg Hx   . Colon cancer Neg Hx     The following portions of the patient's history were reviewed and updated as appropriate: allergies, current medications, past family history, past medical history, past social history, past surgical history and problem list.  Review of Systems Review of Systems  Constitutional: Negative.   HENT: Negative.   Eyes: Negative.   Respiratory:       Decreased exercise tolerance  Cardiovascular: Negative.   Gastrointestinal: Negative.   Genitourinary: Negative.   Musculoskeletal: Negative.   Neurological: Negative.   Endo/Heme/Allergies: Negative.   Psychiatric/Behavioral: Negative.       Objective:   BP 125/78   Pulse 85   Ht 5\' 7"  (1.702 m)   Wt 211 lb 9.6 oz (96 kg)   LMP 11/26/2017 (Exact Date)   BMI 33.14 kg/m  CONSTITUTIONAL: Well-developed, well-nourished female in no acute distress.  PSYCHIATRIC: Normal mood and affect. Normal behavior. Normal judgment and thought content. [place]: Alert and oriented to person, place, and time. Normal muscle tone coordination. No cranial nerve deficit noted. HENT:  Normocephalic, atraumatic, External right and left ear normal. EYES: Conjunctivae and EOM are normal. . No scleral icterus.  NECK: Normal range of motion, supple, no masses.  Nonpalpable thyroid.  Thyroidectomy scar is well-healed, nontender SKIN: Skin is warm and dry. No rash noted. Not diaphoretic. No erythema. No  pallor. CARDIOVASCULAR: Normal heart rate noted, regular rhythm, no murmur. RESPIRATORY: Clear to auscultation on left; right side with decreased breath sounds. Effort and breath sounds normal BREASTS: Symmetric in size. No masses, skin changes, nipple drainage, or lymphadenopathy. ABDOMEN: Soft, normal bowel sounds, no distention noted.  No tenderness, rebound or guarding.  BLADDER: Normal PELVIC:  External Genitalia: Normal  BUS: Normal  Vagina: Normal  Cervix: Normal; No lesions; IUD string 3 cm  Uterus: Normal; midplane, normal size and shape, mobile, nontender  Adnexa: Normal; nonpalpable and nontender  RV: External Exam NormaI  MUSCULOSKELETAL: Normal range of motion. No tenderness.  No cyanosis, clubbing, or edema.  2+ distal pulses. LYMPHATIC: No Axillary, Supraclavicular, or Inguinal Adenopathy.         Assessment:   Annual gynecologic examination 27 y.o. 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 Any transcriptional errors that result from this process are unintentional.

## 2017-12-14 ENCOUNTER — Ambulatory Visit (INDEPENDENT_AMBULATORY_CARE_PROVIDER_SITE_OTHER): Payer: BLUE CROSS/BLUE SHIELD | Admitting: Obstetrics and Gynecology

## 2017-12-14 ENCOUNTER — Encounter: Payer: Self-pay | Admitting: Obstetrics and Gynecology

## 2017-12-14 VITALS — BP 125/78 | HR 85 | Ht 67.0 in | Wt 211.6 lb

## 2017-12-14 DIAGNOSIS — Z01419 Encounter for gynecological examination (general) (routine) without abnormal findings: Secondary | ICD-10-CM | POA: Diagnosis not present

## 2017-12-14 DIAGNOSIS — E669 Obesity, unspecified: Secondary | ICD-10-CM

## 2017-12-14 DIAGNOSIS — Z975 Presence of (intrauterine) contraceptive device: Secondary | ICD-10-CM

## 2017-12-14 DIAGNOSIS — F419 Anxiety disorder, unspecified: Secondary | ICD-10-CM

## 2017-12-14 NOTE — Patient Instructions (Signed)
1.  Pap smear is done. 2.  Self breast awareness is encouraged 3.  Healthy eating and exercise with controlled weight loss 4.  Contraception-ParaGard IUD 5.  Return in 1 year for annual exam 6.  Screening labs are declined  Health Maintenance, Female Adopting a healthy lifestyle and getting preventive care can go a long way to promote health and wellness. Talk with your health care provider about what schedule of regular examinations is right for you. This is a good chance for you to check in with your provider about disease prevention and staying healthy. In between checkups, there are plenty of things you can do on your own. Experts have done a lot of research about which lifestyle changes and preventive measures are most likely to keep you healthy. Ask your health care provider for more information. Weight and diet Eat a healthy diet  Be sure to include plenty of vegetables, fruits, low-fat dairy products, and lean protein.  Do not eat a lot of foods high in solid fats, added sugars, or salt.  Get regular exercise. This is one of the most important things you can do for your health. ? Most adults should exercise for at least 150 minutes each week. The exercise should increase your heart rate and make you sweat (moderate-intensity exercise). ? Most adults should also do strengthening exercises at least twice a week. This is in addition to the moderate-intensity exercise.  Maintain a healthy weight  Body mass index (BMI) is a measurement that can be used to identify possible weight problems. It estimates body fat based on height and weight. Your health care provider can help determine your BMI and help you achieve or maintain a healthy weight.  For females 41 years of age and older: ? A BMI below 18.5 is considered underweight. ? A BMI of 18.5 to 24.9 is normal. ? A BMI of 25 to 29.9 is considered overweight. ? A BMI of 30 and above is considered obese.  Watch levels of cholesterol  and blood lipids  You should start having your blood tested for lipids and cholesterol at 29 years of age, then have this test every 5 years.  You may need to have your cholesterol levels checked more often if: ? Your lipid or cholesterol levels are high. ? You are older than 29 years of age. ? You are at high risk for heart disease.  Cancer screening Lung Cancer  Lung cancer screening is recommended for adults 53-58 years old who are at high risk for lung cancer because of a history of smoking.  A yearly low-dose CT scan of the lungs is recommended for people who: ? Currently smoke. ? Have quit within the past 15 years. ? Have at least a 30-pack-year history of smoking. A pack year is smoking an average of one pack of cigarettes a day for 1 year.  Yearly screening should continue until it has been 15 years since you quit.  Yearly screening should stop if you develop a health problem that would prevent you from having lung cancer treatment.  Breast Cancer  Practice breast self-awareness. This means understanding how your breasts normally appear and feel.  It also means doing regular breast self-exams. Let your health care provider know about any changes, no matter how small.  If you are in your 20s or 30s, you should have a clinical breast exam (CBE) by a health care provider every 1-3 years as part of a regular health exam.  If you are  9 or older, have a CBE every year. Also consider having a breast X-ray (mammogram) every year.  If you have a family history of breast cancer, talk to your health care provider about genetic screening.  If you are at high risk for breast cancer, talk to your health care provider about having an MRI and a mammogram every year.  Breast cancer gene (BRCA) assessment is recommended for women who have family members with BRCA-related cancers. BRCA-related cancers include: ? Breast. ? Ovarian. ? Tubal. ? Peritoneal cancers.  Results of the  assessment will determine the need for genetic counseling and BRCA1 and BRCA2 testing.  Cervical Cancer Your health care provider may recommend that you be screened regularly for cancer of the pelvic organs (ovaries, uterus, and vagina). This screening involves a pelvic examination, including checking for microscopic changes to the surface of your cervix (Pap test). You may be encouraged to have this screening done every 3 years, beginning at age 32.  For women ages 59-65, health care providers may recommend pelvic exams and Pap testing every 3 years, or they may recommend the Pap and pelvic exam, combined with testing for human papilloma virus (HPV), every 5 years. Some types of HPV increase your risk of cervical cancer. Testing for HPV may also be done on women of any age with unclear Pap test results.  Other health care providers may not recommend any screening for nonpregnant women who are considered low risk for pelvic cancer and who do not have symptoms. Ask your health care provider if a screening pelvic exam is right for you.  If you have had past treatment for cervical cancer or a condition that could lead to cancer, you need Pap tests and screening for cancer for at least 20 years after your treatment. If Pap tests have been discontinued, your risk factors (such as having a new sexual partner) need to be reassessed to determine if screening should resume. Some women have medical problems that increase the chance of getting cervical cancer. In these cases, your health care provider may recommend more frequent screening and Pap tests.  Colorectal Cancer  This type of cancer can be detected and often prevented.  Routine colorectal cancer screening usually begins at 29 years of age and continues through 29 years of age.  Your health care provider may recommend screening at an earlier age if you have risk factors for colon cancer.  Your health care provider may also recommend using home test  kits to check for hidden blood in the stool.  A small camera at the end of a tube can be used to examine your colon directly (sigmoidoscopy or colonoscopy). This is done to check for the earliest forms of colorectal cancer.  Routine screening usually begins at age 62.  Direct examination of the colon should be repeated every 5-10 years through 29 years of age. However, you may need to be screened more often if early forms of precancerous polyps or small growths are found.  Skin Cancer  Check your skin from head to toe regularly.  Tell your health care provider about any new moles or changes in moles, especially if there is a change in a mole's shape or color.  Also tell your health care provider if you have a mole that is larger than the size of a pencil eraser.  Always use sunscreen. Apply sunscreen liberally and repeatedly throughout the day.  Protect yourself by wearing long sleeves, pants, a wide-brimmed hat, and sunglasses whenever you  are outside.  Heart disease, diabetes, and high blood pressure  High blood pressure causes heart disease and increases the risk of stroke. High blood pressure is more likely to develop in: ? People who have blood pressure in the high end of the normal range (130-139/85-89 mm Hg). ? People who are overweight or obese. ? People who are African American.  If you are 23-80 years of age, have your blood pressure checked every 3-5 years. If you are 95 years of age or older, have your blood pressure checked every year. You should have your blood pressure measured twice-once when you are at a hospital or clinic, and once when you are not at a hospital or clinic. Record the average of the two measurements. To check your blood pressure when you are not at a hospital or clinic, you can use: ? An automated blood pressure machine at a pharmacy. ? A home blood pressure monitor.  If you are between 24 years and 12 years old, ask your health care provider if you  should take aspirin to prevent strokes.  Have regular diabetes screenings. This involves taking a blood sample to check your fasting blood sugar level. ? If you are at a normal weight and have a low risk for diabetes, have this test once every three years after 29 years of age. ? If you are overweight and have a high risk for diabetes, consider being tested at a younger age or more often. Preventing infection Hepatitis B  If you have a higher risk for hepatitis B, you should be screened for this virus. You are considered at high risk for hepatitis B if: ? You were born in a country where hepatitis B is common. Ask your health care provider which countries are considered high risk. ? Your parents were born in a high-risk country, and you have not been immunized against hepatitis B (hepatitis B vaccine). ? You have HIV or AIDS. ? You use needles to inject street drugs. ? You live with someone who has hepatitis B. ? You have had sex with someone who has hepatitis B. ? You get hemodialysis treatment. ? You take certain medicines for conditions, including cancer, organ transplantation, and autoimmune conditions.  Hepatitis C  Blood testing is recommended for: ? Everyone born from [birth year] through 1967. ? Anyone with known risk factors for hepatitis C.  Sexually transmitted infections (STIs)  You should be screened for sexually transmitted infections (STIs) including gonorrhea and chlamydia if: ? You are sexually active and are younger than 29 years of age. ? You are older than 29 years of age and your health care provider tells you that you are at risk for this type of infection. ? Your sexual activity has changed since you were last screened and you are at an increased risk for chlamydia or gonorrhea. Ask your health care provider if you are at risk.  If you do not have HIV, but are at risk, it may be recommended that you take a prescription medicine daily to prevent HIV infection. This is  called pre-exposure prophylaxis (PrEP). You are considered at risk if: ? You are sexually active and do not regularly use condoms or know the HIV status of your partner(s). ? You take drugs by injection. ? You are sexually active with a partner who has HIV.  Talk with your health care provider about whether you are at high risk of being infected with HIV. If you choose to begin PrEP, you should first be  tested for HIV. You should then be tested every 3 months for as long as you are taking PrEP. Pregnancy  If you are premenopausal and you may become pregnant, ask your health care provider about preconception counseling.  If you may become pregnant, take 400 to 800 micrograms (mcg) of folic acid every day.  If you want to prevent pregnancy, talk to your health care provider about birth control (contraception). Osteoporosis and menopause  Osteoporosis is a disease in which the bones lose minerals and strength with aging. This can result in serious bone fractures. Your risk for osteoporosis can be identified using a bone density scan.  If you are 73 years of age or older, or if you are at risk for osteoporosis and fractures, ask your health care provider if you should be screened.  Ask your health care provider whether you should take a calcium or vitamin D supplement to lower your risk for osteoporosis.  Menopause may have certain physical symptoms and risks.  Hormone replacement therapy may reduce some of these symptoms and risks. Talk to your health care provider about whether hormone replacement therapy is right for you. Follow these instructions at home:  Schedule regular health, dental, and eye exams.  Stay current with your immunizations.  Do not use any tobacco products including cigarettes, chewing tobacco, or electronic cigarettes.  If you are pregnant, do not drink alcohol.  If you are breastfeeding, limit how much and how often you drink alcohol.  Limit alcohol intake to  no more than 1 drink per day for nonpregnant women. One drink equals 12 ounces of beer, 5 ounces of wine, or 1 ounces of hard liquor.  Do not use street drugs.  Do not share needles.  Ask your health care provider for help if you need support or information about quitting drugs.  Tell your health care provider if you often feel depressed.  Tell your health care provider if you have ever been abused or do not feel safe at home. This information is not intended to replace advice given to you by your health care provider. Make sure you discuss any questions you have with your health care provider. Document Released: 02/23/2011 Document Revised: 01/16/2016 Document Reviewed: 05/14/2015 [employer] Interactive Patient Education  [employer].

## 2017-12-15 LAB — PAP IG W/ RFLX HPV ASCU: PAP SMEAR COMMENT: 0

## 2018-04-29 ENCOUNTER — Other Ambulatory Visit: Payer: Self-pay

## 2018-04-29 MED ORDER — FLUCONAZOLE 150 MG PO TABS: 150.0000 mg | ORAL_TABLET | Freq: Every day | ORAL | 0 refills | Status: AC
